# Patient Record
Sex: Female | Born: 1980 | Race: Black or African American | Hispanic: No | Marital: Single | Smoking: Never smoker
Health system: Southern US, Community
[De-identification: ages and names within clinical notes are randomized; demographics above are authoritative.]

## PROBLEM LIST (undated history)

## (undated) DIAGNOSIS — F32A Depression, unspecified: Secondary | ICD-10-CM

## (undated) DIAGNOSIS — E7800 Pure hypercholesterolemia, unspecified: Secondary | ICD-10-CM

## (undated) DIAGNOSIS — F329 Major depressive disorder, single episode, unspecified: Secondary | ICD-10-CM

## (undated) DIAGNOSIS — M069 Rheumatoid arthritis, unspecified: Secondary | ICD-10-CM

## (undated) HISTORY — DX: Rheumatoid arthritis, unspecified: M06.9

## (undated) HISTORY — DX: Pure hypercholesterolemia, unspecified: E78.00

## (undated) HISTORY — DX: Depression, unspecified: F32.A

## (undated) HISTORY — PX: TUBAL LIGATION: SHX77

## (undated) HISTORY — DX: Major depressive disorder, single episode, unspecified: F32.9

---

## 2018-03-11 ENCOUNTER — Encounter: Payer: Self-pay | Admitting: Medical

## 2018-03-11 ENCOUNTER — Other Ambulatory Visit: Payer: Self-pay

## 2018-03-11 ENCOUNTER — Ambulatory Visit: Payer: Medicaid Other | Admitting: Medical

## 2018-03-11 ENCOUNTER — Other Ambulatory Visit (HOSPITAL_COMMUNITY)
Admission: RE | Admit: 2018-03-11 | Discharge: 2018-03-11 | Disposition: A | Discharge status: Home / Self Care | Payer: Medicaid Other | Source: Ambulatory Visit | Attending: Medical | Admitting: Medical

## 2018-03-11 VITALS — BP 141/89 | HR 92 | Ht 62.0 in | Wt 189.0 lb

## 2018-03-11 DIAGNOSIS — Z01419 Encounter for gynecological examination (general) (routine) without abnormal findings: Secondary | ICD-10-CM

## 2018-03-11 DIAGNOSIS — N760 Acute vaginitis: Secondary | ICD-10-CM

## 2018-03-11 DIAGNOSIS — N765 Ulceration of vagina: Secondary | ICD-10-CM

## 2018-03-11 DIAGNOSIS — Z8742 Personal history of other diseases of the female genital tract: Secondary | ICD-10-CM

## 2018-03-11 DIAGNOSIS — Z0000 Encounter for general adult medical examination without abnormal findings: Secondary | ICD-10-CM

## 2018-03-11 DIAGNOSIS — M069 Rheumatoid arthritis, unspecified: Secondary | ICD-10-CM | POA: Insufficient documentation

## 2018-03-11 DIAGNOSIS — B9689 Other specified bacterial agents as the cause of diseases classified elsewhere: Secondary | ICD-10-CM

## 2018-03-11 NOTE — Patient Instructions (Addendum)
Pap Test Why am I having this test? A Pap test, also called a Pap smear, is a screening test to check for signs of:  Cancer of the vagina, cervix, and uterus. The cervix is the lower part of the uterus that opens into the vagina.  Infection.  Changes that may be a sign that cancer is developing (precancerous changes). Women need this test on a regular basis. In general, you should have a Pap test every 3 years until you reach menopause or age 37. Women aged 30-60 may choose to have their Pap test done at the same time as an HPV (human papillomavirus) test every 5 years (instead of every 3 years). Your health care provider may recommend having Pap tests more or less often depending on your medical conditions and past Pap test results. What kind of sample is taken?  Your health care provider will collect a sample of cells from the surface of your cervix. This will be done using a small cotton swab, plastic spatula, or brush. This sample is often collected during a pelvic exam, when you are lying on your back on an exam table with feet in footrests (stirrups). In some cases, fluids (secretions) from the cervix or vagina may also be collected. How do I prepare for this test?  Be aware of where you are in your menstrual cycle. If you are menstruating on the day of the test, you may be asked to reschedule.  You may need to reschedule if you have a known vaginal infection on the day of the test.  Follow instructions from your health care provider about: ? Changing or stopping your regular medicines. Some medicines can cause abnormal test results, such as digitalis and tetracycline. ? Avoiding douching or taking a bath the day before or the day of the test. Tell a health care provider about:  Any allergies you have.  All medicines you are taking, including vitamins, herbs, eye drops, creams, and over-the-counter medicines.  Any blood disorders you have.  Any surgeries you have had.  Any  medical conditions you have.  Whether you are pregnant or may be pregnant. How are the results reported? Your test results will be reported as either abnormal or normal. A false-positive result can occur. A false positive is incorrect because it means that a condition is present when it is not. A false-negative result can occur. A false negative is incorrect because it means that a condition is not present when it is. What do the results mean? A normal test result means that you do not have signs of cancer of the vagina, cervix, or uterus. An abnormal result may mean that you have:  Cancer. A Pap test by itself is not enough to diagnose cancer. You will have more tests done in this case.  Precancerous changes in your vagina, cervix, or uterus.  Inflammation of the cervix.  An STD (sexually transmitted disease).  A fungal infection.  A parasite infection. Talk with your health care provider about what your results mean. Questions to ask your health care provider Ask your health care provider, or the department that is doing the test:  When will my results be ready?  How will I get my results?  What are my treatment options?  What other tests do I need?  What are my next steps? Summary  In general, women should have a Pap test every 3 years until they reach menopause or age 37.  Your health care provider will collect a   sample of cells from the surface of your cervix. This will be done using a small cotton swab, plastic spatula, or brush.  In some cases, fluids (secretions) from the cervix or vagina may also be collected. This information is not intended to replace advice given to you by your health care provider. Make sure you discuss any questions you have with your health care provider. Document Released: 03/21/2002 Document Revised: 09/07/2016 Document Reviewed: 09/07/2016 Elsevier Interactive Patient Education  2019 [employer].  Contraception Choices Contraception,  also called birth control, means things to use or ways to try not to get pregnant. Hormonal birth control This kind of birth control uses hormones. Here are some types of hormonal birth control:  A tube that is put under skin of the arm (implant). The tube can stay in for as long as 3 years.  Shots to get every 3 months (injections).  Pills to take every day (birth control pills).  A patch to change 1 time each week for 3 weeks (birth control patch). After that, the patch is taken off for 1 week.  A ring to put in the vagina. The ring is left in for 3 weeks. Then it is taken out of the vagina for 1 week. Then a new ring is put in.  Pills to take after unprotected sex (emergency birth control pills). Barrier birth control Here are some types of barrier birth control:  A thin covering that is put on the penis before sex (female condom). The covering is thrown away after sex.  A soft, loose covering that is put in the vagina before sex (female condom). The covering is thrown away after sex.  A rubber bowl that sits over the cervix (diaphragm). The bowl must be made for you. The bowl is put into the vagina before sex. The bowl is left in for 6-8 hours after sex. It is taken out within 24 hours.  A small, soft cup that fits over the cervix (cervical cap). The cup must be made for you. The cup can be left in for 6-8 hours after sex. It is taken out within 48 hours.  A sponge that is put into the vagina before sex. It must be left in for at least 6 hours after sex. It must be taken out within 30 hours. Then it is thrown away.  A chemical that kills or stops sperm from getting into the uterus (spermicide). It may be a pill, cream, jelly, or foam to put in the vagina. The chemical should be used at least 10-15 minutes before sex. IUD (intrauterine) birth control An IUD is a small, T-shaped piece of plastic. It is put inside the uterus. There are two kinds:  Hormone IUD. This kind can stay in for  3-5 years.  Copper IUD. This kind can stay in for 10 years. Permanent birth control Here are some types of permanent birth control:  Surgery to block the fallopian tubes.  Having an insert put into each fallopian tube.  Surgery to tie off the tubes that carry sperm (vasectomy). Natural planning birth control Here are some types of natural planning birth control:  Not having sex on the days the woman could get pregnant.  Using a calendar: ? To keep track of the length of each period. ? To find out what days pregnancy can happen. ? To plan to not have sex on days when pregnancy can happen.  Watching for symptoms of ovulation and not having sex during ovulation. One way  the woman can check for ovulation is to check her temperature.  Waiting to have sex until after ovulation. Summary  Contraception, also called birth control, means things to use or ways to try not to get pregnant.  Hormonal methods of birth control include implants, injections, pills, patches, vaginal rings, and emergency birth control pills.  Barrier methods of birth control can include female condoms, female condoms, diaphragms, cervical caps, sponges, and spermicides.  There are two types of IUD (intrauterine device) birth control. An IUD can be put in a woman's uterus to prevent pregnancy for 3-5 years.  Permanent sterilization can be done through a procedure for males, females, or both.  Natural planning methods involve not having sex on the days when the woman could get pregnant. This information is not intended to replace advice given to you by your health care provider. Make sure you discuss any questions you have with your health care provider. Document Released: 10/26/2008 Document Revised: 08/05/2017 Document Reviewed: 01/09/2016 Elsevier Interactive Patient Education  2019 [employer].

## 2018-03-11 NOTE — Progress Notes (Signed)
Subjective:    Toni Vaughn is a 38 y.o. female who presents for an annual exam. The patient has complaint of vaginal odor x 3 weeks. She has a history of abnormal pap smear 10 and 16 years ago. The patient is sexually active. GYN screening history: last pap: approximate date 2018 and was normal. The patient participates in regular exercise: no. Has the patient ever been transfused or tattooed?: Yes - tattoo, No - transfusion. The patient reports that there is not domestic violence in her life.   Menstrual History: OB History    Gravida  5   Para  3   Term  3   Preterm  0   AB  2   Living  3     SAB  0   TAB  0   Ectopic  0   Multiple  0   Live Births  3           Menarche age: 38 years old  Patient's last menstrual period was 03/03/2018.    The following portions of the patient's history were reviewed and updated as appropriate: allergies, current medications, past family history, past medical history, past social history, past surgical history and problem list.  Review of Systems Review of Systems  Constitutional: Negative for fever.  Gastrointestinal: Negative for abdominal pain, constipation, diarrhea, nausea and vomiting.  Genitourinary: Negative for dysuria, frequency, hematuria and urgency.       + vaginal discharge Neg - vaginal bleeding     Objective:   Physical Exam  Nursing note and vitals reviewed. Constitutional: She is oriented to person, place, and time. She appears well-developed and well-nourished. No distress.  HENT:  Head: Normocephalic and atraumatic.  Eyes: EOM are normal.  Neck: Neck supple. No thyromegaly present.  Cardiovascular: Normal rate, regular rhythm and normal heart sounds.  Respiratory: Effort normal and breath sounds normal. No respiratory distress. She has no wheezes.  GI: Soft. Bowel sounds are normal. She exhibits no distension and no mass. There is no abdominal tenderness. There is no rebound and no guarding.    Genitourinary:    Uterus is not enlarged and not tender. Cervix exhibits no motion tenderness, no discharge and no friability. Right adnexum displays no mass and no tenderness. Left adnexum displays no mass and no tenderness.    Vaginal discharge (scant white, non-odorous) present.     No vaginal bleeding.  No bleeding in the vagina.  Neurological: She is alert and oriented to person, place, and time.  Skin: Skin is warm and dry. No erythema.  Psychiatric: She has a normal mood and affect.     Assessment:    Healthy female exam.   Birth Control Counseling   Plan:     Await pap smear results.   STD testing today  Patient will be contacted with any abnormal results Encouraged to sign up for MyChart Considering Depo Provera for birth control, does not want to start today Return to [facility] in 1 year or sooner PRN    Toni Vaughn 03/11/2018 11:37 AM

## 2018-03-12 LAB — HEPATITIS B SURFACE ANTIGEN: Hepatitis B Surface Ag: NEGATIVE

## 2018-03-12 LAB — HEPATITIS C ANTIBODY

## 2018-03-12 LAB — HIV ANTIBODY (ROUTINE TESTING W REFLEX): HIV Screen 4th Generation wRfx: NONREACTIVE

## 2018-03-12 LAB — RPR: RPR Ser Ql: NONREACTIVE

## 2018-03-14 LAB — HERPES SIMPLEX VIRUS CULTURE

## 2018-03-15 LAB — CERVICOVAGINAL ANCILLARY ONLY
Bacterial vaginitis: POSITIVE — AB
Candida vaginitis: NEGATIVE
Trichomonas: NEGATIVE

## 2018-03-15 LAB — CYTOLOGY - PAP
Chlamydia: NEGATIVE
Diagnosis: NEGATIVE
HPV: NOT DETECTED
Neisseria Gonorrhea: NEGATIVE

## 2018-03-16 MED ORDER — METRONIDAZOLE 500 MG PO TABS: 500.0000 mg | ORAL_TABLET | Freq: Two times a day (BID) | ORAL | 0 refills | Status: DC

## 2018-03-16 NOTE — Addendum Note (Signed)
Addended by: Marny Lowenstein on: 03/16/2018 01:18 PM   Modules accepted: Orders

## 2018-04-17 ENCOUNTER — Ambulatory Visit (HOSPITAL_COMMUNITY)
Admission: EM | Admit: 2018-04-17 | Discharge: 2018-04-17 | Disposition: A | Discharge status: Home / Self Care | Payer: Medicaid Other | Attending: Family Medicine | Admitting: Family Medicine

## 2018-04-17 ENCOUNTER — Other Ambulatory Visit: Payer: Self-pay

## 2018-04-17 ENCOUNTER — Encounter (HOSPITAL_COMMUNITY): Payer: Self-pay | Admitting: Emergency Medicine

## 2018-04-17 DIAGNOSIS — N3001 Acute cystitis with hematuria: Secondary | ICD-10-CM | POA: Diagnosis not present

## 2018-04-17 DIAGNOSIS — Z3202 Encounter for pregnancy test, result negative: Secondary | ICD-10-CM

## 2018-04-17 LAB — POCT PREGNANCY, URINE: Preg Test, Ur: NEGATIVE

## 2018-04-17 LAB — POCT URINALYSIS DIP (DEVICE)
Bilirubin Urine: NEGATIVE
Glucose, UA: NEGATIVE mg/dL
Ketones, ur: NEGATIVE mg/dL
Nitrite: NEGATIVE
Protein, ur: 100 mg/dL — AB
Specific Gravity, Urine: 1.025 (ref 1.005–1.030)
Urobilinogen, UA: 1 mg/dL (ref 0.0–1.0)
pH: 7.5 (ref 5.0–8.0)

## 2018-04-17 MED ORDER — CEPHALEXIN 500 MG PO CAPS: 500.0000 mg | ORAL_CAPSULE | Freq: Three times a day (TID) | ORAL | 0 refills | Status: DC

## 2018-04-17 NOTE — ED Provider Notes (Signed)
[facility]    CSN: [id number] Arrival date & time: 04/17/18  1140     History   Chief Complaint Chief Complaint  Patient presents with  . Urinary Urgency    HPI Toni Vaughn is a 38 y.o. female.   The patient presented to the [facility] with a complaint of urinary frequency/urgency and some mild lower abdominal pain x 3 days. The patient also reported some mild hematuria that started today.  No recent urinary tract infections.  Patient works as a Chief Strategy Officer.     Past Medical History:  Diagnosis Date  . Depression   . High cholesterol   . Rheumatoid arthritis [facility])     Patient Active Problem List   Diagnosis Date Noted  . Rheumatoid arthritis (HCC) 03/11/2018  . History of abnormal cervical Pap smear 03/11/2018    Past Surgical History:  Procedure Laterality Date  . TUBAL LIGATION      OB History    Gravida  5   Para  3   Term  3   Preterm  0   AB  2   Living  3     SAB  0   TAB  0   Ectopic  0   Multiple  0   Live Births  3            Home Medications    Prior to Admission medications   Medication Sig Start Date End Date Taking? Authorizing Provider  atorvastatin (LIPITOR) 40 MG tablet Take 40 mg by mouth daily.    [provider]  cephALEXin (KEFLEX) 500 MG capsule Take 1 capsule (500 mg total) by mouth 3 (three) times daily. 04/17/18   Elvina Sidle, MD  diphenhydrAMINE HCl (ALLERGY MED PO) Take by mouth as needed.    [provider]  escitalopram (LEXAPRO) 10 MG tablet Take 10 mg by mouth daily.    [provider]  folic acid (FOLVITE) 1 MG tablet Take 1 mg by mouth daily.    [provider]  meloxicam (MOBIC) 15 MG tablet Take 15 mg by mouth daily.    [provider]    Family History Family History  Problem Relation Age of Onset  . Hypertension Mother   . COPD Mother   . Emphysema Mother   . Cervical cancer Mother   . Mental illness Father     Social History  Social History   Tobacco Use  . Smoking status: Never Smoker  . Smokeless tobacco: Never Used  Substance Use Topics  . Alcohol use: Yes    Alcohol/week: 1.0 standard drinks    Types: 1 Standard drinks or equivalent per week  . Drug use: Never     Allergies   Patient has no known allergies.   Review of Systems Review of Systems  Gastrointestinal: Positive for abdominal pain.  Genitourinary: Positive for dysuria, hematuria and urgency.  All other systems reviewed and are negative.    Physical Exam Triage Vital Signs ED Triage Vitals [04/17/18 1233]  Enc Vitals Group     BP (!) 149/99     Pulse Rate 67     Resp 18     Temp 98 F (36.7 C)     Temp Source Oral     SpO2 100 %     Weight      Height      Head Circumference      Peak Flow      Pain Score 4  Pain Loc      Pain Edu?      Excl. in GC?    No data found.  Updated Vital Signs BP (!) 149/99 (BP Location: Right Arm)   Pulse 67   Temp 98 F (36.7 C) (Oral)   Resp 18   LMP 03/29/2018   SpO2 100%    Physical Exam Vitals signs and nursing note reviewed.  Constitutional:      Appearance: Normal appearance.  Neck:     Musculoskeletal: Normal range of motion.  Pulmonary:     Effort: Pulmonary effort is normal.  Musculoskeletal: Normal range of motion.  Skin:    General: Skin is warm and dry.  Neurological:     General: No focal deficit present.     Mental Status: She is alert.  Psychiatric:        Mood and Affect: Mood normal.      UC Treatments / Results  Labs (all labs ordered are listed, but only abnormal results are displayed) Labs Reviewed  POCT URINALYSIS DIP (DEVICE) - Abnormal; Notable for the following components:      Result Value   Hgb urine dipstick LARGE (*)    Protein, ur 100 (*)    Leukocytes,Ua SMALL (*)    All other components within normal limits  URINE CULTURE    EKG None  Radiology No results found.  Procedures Procedures (including critical care time)   Medications Ordered in UC Medications - No data to display  Initial Impression / Assessment and Plan / UC Course  I have reviewed the triage vital signs and the nursing notes.  Pertinent labs & imaging results that were available during my care of the patient were reviewed by me and considered in my medical decision making (see chart for details).    Final Clinical Impressions(s) / UC Diagnoses   Final diagnoses:  Acute cystitis with hematuria   Discharge Instructions   None    ED Prescriptions    Medication Sig Dispense Auth. Provider   cephALEXin (KEFLEX) 500 MG capsule Take 1 capsule (500 mg total) by mouth 3 (three) times daily. 20 capsule Elvina Sidle, MD     Controlled Substance Prescriptions [place] Controlled Substance Registry consulted? Not Applicable   Elvina Sidle, MD 04/17/18 1244

## 2018-04-17 NOTE — ED Triage Notes (Signed)
The patient presented to the [facility] with a complaint of urinary frequency/urgency and some mild lower abdominal pain x 3 days. The patient also reported some mild hematuria that started today.

## 2018-04-18 LAB — URINE CULTURE: Culture: NO GROWTH

## 2018-05-16 ENCOUNTER — Other Ambulatory Visit: Payer: Self-pay | Admitting: *Deleted

## 2018-05-16 DIAGNOSIS — N760 Acute vaginitis: Principal | ICD-10-CM

## 2018-05-16 DIAGNOSIS — B9689 Other specified bacterial agents as the cause of diseases classified elsewhere: Secondary | ICD-10-CM

## 2018-05-16 MED ORDER — METRONIDAZOLE 500 MG PO TABS: 500.0000 mg | ORAL_TABLET | Freq: Two times a day (BID) | ORAL | 0 refills | Status: DC

## 2018-05-16 NOTE — Progress Notes (Signed)
Flagyl sent for BV tx per protocol.

## 2018-07-05 ENCOUNTER — Other Ambulatory Visit: Payer: Self-pay

## 2018-07-05 DIAGNOSIS — B9689 Other specified bacterial agents as the cause of diseases classified elsewhere: Secondary | ICD-10-CM

## 2018-07-05 MED ORDER — METRONIDAZOLE 500 MG PO TABS: 500.0000 mg | ORAL_TABLET | Freq: Two times a day (BID) | ORAL | 0 refills | Status: DC

## 2018-07-05 NOTE — Progress Notes (Signed)
Pt has vaginal discharge with foul odor.

## 2018-08-02 ENCOUNTER — Other Ambulatory Visit: Payer: Self-pay

## 2018-08-02 ENCOUNTER — Other Ambulatory Visit: Payer: Medicaid Other

## 2018-08-02 DIAGNOSIS — Z20822 Contact with and (suspected) exposure to covid-19: Secondary | ICD-10-CM

## 2018-08-04 LAB — NOVEL CORONAVIRUS, NAA: SARS-CoV-2, NAA: NOT DETECTED

## 2018-10-10 ENCOUNTER — Other Ambulatory Visit: Payer: Self-pay

## 2018-10-10 ENCOUNTER — Ambulatory Visit (INDEPENDENT_AMBULATORY_CARE_PROVIDER_SITE_OTHER): Payer: Medicaid Other

## 2018-10-10 ENCOUNTER — Other Ambulatory Visit (HOSPITAL_COMMUNITY)
Admission: RE | Admit: 2018-10-10 | Discharge: 2018-10-10 | Disposition: A | Discharge status: Home / Self Care | Payer: Medicaid Other | Source: Ambulatory Visit | Attending: Obstetrics | Admitting: Obstetrics

## 2018-10-10 VITALS — BP 131/85 | HR 71 | Ht 62.0 in | Wt 194.0 lb

## 2018-10-10 DIAGNOSIS — N898 Other specified noninflammatory disorders of vagina: Secondary | ICD-10-CM | POA: Insufficient documentation

## 2018-10-10 NOTE — Progress Notes (Signed)
SUBJECTIVE:  38 y.o. female complains of malodorous vaginal discharge for 3 week(s). Denies abnormal vaginal bleeding or significant pelvic pain or fever. No UTI symptoms. Denies history of known exposure to STD.  Patient's last menstrual period was 09/28/2018 (exact date).  OBJECTIVE:  She appears well, afebrile. Urine dipstick: not done.  ASSESSMENT:  Vaginal Discharge  Vaginal Odor   PLAN:  GC, chlamydia, trichomonas, BVAG, CVAG probe sent to lab. Treatment: To be determined once lab results are received ROV prn if symptoms persist or worsen.

## 2018-10-11 ENCOUNTER — Other Ambulatory Visit: Payer: Self-pay | Admitting: Obstetrics

## 2018-10-11 DIAGNOSIS — B9689 Other specified bacterial agents as the cause of diseases classified elsewhere: Secondary | ICD-10-CM

## 2018-10-11 LAB — CERVICOVAGINAL ANCILLARY ONLY
Bacterial Vaginitis (gardnerella): POSITIVE — AB
Candida Glabrata: NEGATIVE
Candida Vaginitis: NEGATIVE
Chlamydia: NEGATIVE
Molecular Disclaimer: NEGATIVE
Molecular Disclaimer: NEGATIVE
Molecular Disclaimer: NEGATIVE
Molecular Disclaimer: NEGATIVE
Molecular Disclaimer: NORMAL
Molecular Disclaimer: NORMAL
Neisseria Gonorrhea: NEGATIVE
Trichomonas: NEGATIVE

## 2018-10-11 MED ORDER — TINIDAZOLE 500 MG PO TABS: 1000.0000 mg | ORAL_TABLET | Freq: Every day | ORAL | 2 refills | Status: DC

## 2018-10-12 ENCOUNTER — Telehealth: Payer: Self-pay | Admitting: *Deleted

## 2018-10-12 NOTE — Telephone Encounter (Signed)
-----   Message from Shelly Bombard, MD sent at 10/11/2018  4:34 PM EDT ----- [facility] Rx for BV

## 2018-12-28 DIAGNOSIS — Z79899 Other long term (current) drug therapy: Secondary | ICD-10-CM | POA: Insufficient documentation

## 2019-01-30 ENCOUNTER — Ambulatory Visit (HOSPITAL_COMMUNITY)
Admission: EM | Admit: 2019-01-30 | Discharge: 2019-01-30 | Disposition: A | Discharge status: Home / Self Care | Payer: Medicaid Other | Attending: Family Medicine | Admitting: Family Medicine

## 2019-01-30 ENCOUNTER — Encounter (HOSPITAL_COMMUNITY): Payer: Self-pay

## 2019-01-30 ENCOUNTER — Other Ambulatory Visit: Payer: Self-pay

## 2019-01-30 DIAGNOSIS — M545 Low back pain, unspecified: Secondary | ICD-10-CM

## 2019-01-30 MED ORDER — IBUPROFEN 600 MG PO TABS: 600.0000 mg | ORAL_TABLET | Freq: Four times a day (QID) | ORAL | 0 refills | Status: DC | PRN

## 2019-01-30 MED ORDER — CYCLOBENZAPRINE HCL 5 MG PO TABS: 5.0000 mg | ORAL_TABLET | Freq: Two times a day (BID) | ORAL | 0 refills | Status: DC | PRN

## 2019-01-30 NOTE — ED Provider Notes (Signed)
[place]    CSN: [id number] Arrival date & time: 01/30/19  1227      History   Chief Complaint Chief Complaint  Patient presents with   Fall   Back Pain    HPI Toni Vaughn is a 39 y.o. female history of rheumatoid arthritis presenting today for evaluation of back pain.  Patient states that 2 days ago she fell down approximately 3-4 indoor stairs.  Landed on her lower back.  Denies any immediate pain, the following day she has felt some tightness in her right lower back which has worsened over the past 24 to 48 hours.  She has had pain with certain movements and difficulty getting comfortable.  Denies radiation into legs.  Denies numbness or tingling.  Denies loss of control of urination or bowel movements.  Denies saddle anesthesia.  Denies history of similar or prior back problems.  She took ibuprofen 800 last night.  Is currently on a tapering dose of prednisone for her rheumatoid.  HPI  Past Medical History:  Diagnosis Date   Depression    High cholesterol    Rheumatoid arthritis [facility])     Patient Active Problem List   Diagnosis Date Noted   Rheumatoid arthritis ([place]) 03/11/2018   History of abnormal cervical Pap smear 03/11/2018    Past Surgical History:  Procedure Laterality Date   TUBAL LIGATION      OB History    Gravida  5   Para  3   Term  3   Preterm  0   AB  2   Living  3     SAB  0   TAB  0   Ectopic  0   Multiple  0   Live Births  3            Home Medications    Prior to Admission medications   Medication Sig Start Date End Date Taking? Authorizing Provider  atorvastatin (LIPITOR) 40 MG tablet Take 40 mg by mouth daily.    [provider]  cephALEXin (KEFLEX) 500 MG capsule Take 1 capsule (500 mg total) by mouth 3 (three) times daily. 04/17/18   Robyn Haber, MD  cyclobenzaprine (FLEXERIL) 5 MG tablet Take 1-2 tablets (5-10 mg total) by mouth 2 (two) times daily as needed for muscle spasms.  01/30/19   Marylou Wages C, PA-C  diphenhydrAMINE HCl (ALLERGY MED PO) Take by mouth as needed.    [provider]  escitalopram (LEXAPRO) 10 MG tablet Take 10 mg by mouth daily.    [provider]  etanercept (ENBREL) 50 MG/ML injection INJECT 1 ML UNDER THE SKIN ONCE A WEEK 09/20/18   [provider]  folic acid (FOLVITE) 1 MG tablet Take 1 mg by mouth daily.    [provider]  ibuprofen (ADVIL) 600 MG tablet Take 1 tablet (600 mg total) by mouth every 6 (six) hours as needed. 01/30/19   Dois Juarbe C, PA-C  meloxicam (MOBIC) 15 MG tablet Take 15 mg by mouth daily.    [provider]  metroNIDAZOLE (FLAGYL) 500 MG tablet Take 1 tablet (500 mg total) by mouth 2 (two) times daily. Patient not taking: Reported on 10/10/2018 07/05/18   Shelly Bombard, MD  tinidazole [facility]) 500 MG tablet Take 2 tablets (1,000 mg total) by mouth daily with breakfast. 10/11/18   Shelly Bombard, MD    Family History Family History  Problem Relation Age of Onset   Hypertension Mother  COPD Mother    Emphysema Mother    Cervical cancer Mother    Mental illness Father     Social History Social History   Tobacco Use   Smoking status: Never Smoker   Smokeless tobacco: Never Used  Substance Use Topics   Alcohol use: Yes    Alcohol/week: 1.0 standard drinks    Types: 1 Standard drinks or equivalent per week   Drug use: Never     Allergies   Patient has no known allergies.   Review of Systems Review of Systems  Constitutional: Negative for fatigue and fever.  Eyes: Negative for visual disturbance.  Respiratory: Negative for shortness of breath.   Cardiovascular: Negative for chest pain.  Gastrointestinal: Negative for abdominal pain, nausea and vomiting.  Genitourinary: Negative for decreased urine volume and difficulty urinating.  Musculoskeletal: Positive for back pain and myalgias. Negative for arthralgias and joint swelling.    Skin: Negative for color change, rash and wound.  Neurological: Negative for dizziness, weakness, light-headedness and headaches.     Physical Exam Triage Vital Signs ED Triage Vitals [01/30/19 1332]  Enc Vitals Group     BP (!) 148/91     Pulse Rate 66     Resp 16     Temp 98.5 F (36.9 C)     Temp Source Oral     SpO2 100 %     Weight      Height      Head Circumference      Peak Flow      Pain Score 8     Pain Loc      Pain Edu?      Excl. in GC?    No data found.  Updated Vital Signs BP (!) 148/91 (BP Location: Right Arm)    Pulse 66    Temp 98.5 F (36.9 C) (Oral)    Resp 16    SpO2 100%   Visual Acuity Right Eye Distance:   Left Eye Distance:   Bilateral Distance:    Right Eye Near:   Left Eye Near:    Bilateral Near:     Physical Exam Vitals and nursing note reviewed.  Constitutional:      Appearance: She is well-developed.     Comments: No acute distress  HENT:     Head: Normocephalic and atraumatic.     Nose: Nose normal.  Eyes:     Conjunctiva/sclera: Conjunctivae normal.  Cardiovascular:     Rate and Rhythm: Normal rate.  Pulmonary:     Effort: Pulmonary effort is normal. No respiratory distress.  Abdominal:     General: There is no distension.  Musculoskeletal:        General: Normal range of motion.     Cervical back: Neck supple.     Comments: Mild tenderness to palpation of mid lumbar spine, increased tenderness throughout right lumbar musculature  Hip strength 5/5 and equal bilaterally, knee strength 5/5 and equal bilaterally, patellar reflex 2+ bilaterally  Able to ambulate from chair to exam table without assistance or abnormality  Skin:    General: Skin is warm and dry.  Neurological:     Mental Status: She is alert and oriented to person, place, and time.      UC Treatments / Results  Labs (all labs ordered are listed, but only abnormal results are displayed) Labs Reviewed - No data to display  EKG   Radiology No  results found.  Procedures Procedures (including critical care time)  Medications Ordered in UC Medications - No data to display  Initial Impression / Assessment and Plan / UC Course  I have reviewed the triage vital signs and the nursing notes.  Pertinent labs & imaging results that were available during my care of the patient were reviewed by me and considered in my medical decision making (see chart for details).    Tenderness more lateral, most likely muscular strain, no immediate pain.  Will treat with anti-inflammatories and muscle relaxers.  Deferring imaging.  Gentle stretching, ice and heat.  No red flags for cauda equina.Discussed strict return precautions. Patient verbalized understanding and is agreeable with plan.  Final Clinical Impressions(s) / UC Diagnoses   Final diagnoses:  Acute right-sided low back pain without sciatica     Discharge Instructions     Use anti-inflammatories for pain/swelling. You may take up to 600-800 mg Ibuprofen every 8 hours with food. You may supplement Ibuprofen with Tylenol [phone] mg every 8 hours.   You may use flexeril as needed to help with pain. This is a muscle relaxer and causes sedation- please use only at bedtime or when you will be home and not have to drive/work  Alternate ice and heat  Gentle stretching of low back  Follow up if pain not improving or worsening   ED Prescriptions    Medication Sig Dispense Auth. Provider   cyclobenzaprine (FLEXERIL) 5 MG tablet Take 1-2 tablets (5-10 mg total) by mouth 2 (two) times daily as needed for muscle spasms. 24 tablet Sharhonda Atwood C, PA-C   ibuprofen (ADVIL) 600 MG tablet Take 1 tablet (600 mg total) by mouth every 6 (six) hours as needed. 30 tablet Tionna Gigante, [place] C, PA-C     PDMP not reviewed this encounter.   Lew Dawes, [place] 01/30/19 1419

## 2019-01-30 NOTE — Discharge Instructions (Signed)
Use anti-inflammatories for pain/swelling. You may take up to 600-800 mg Ibuprofen every 8 hours with food. You may supplement Ibuprofen with Tylenol [phone] mg every 8 hours.   You may use flexeril as needed to help with pain. This is a muscle relaxer and causes sedation- please use only at bedtime or when you will be home and not have to drive/work  Alternate ice and heat  Gentle stretching of low back  Follow up if pain not improving or worsening

## 2019-01-30 NOTE — ED Triage Notes (Signed)
Pt present she fall on Saturday on her back/buttocks and now she is having lower back pain.

## 2019-02-10 ENCOUNTER — Telehealth: Payer: Self-pay

## 2019-02-10 ENCOUNTER — Other Ambulatory Visit: Payer: Self-pay

## 2019-02-10 DIAGNOSIS — B9689 Other specified bacterial agents as the cause of diseases classified elsewhere: Secondary | ICD-10-CM

## 2019-02-10 DIAGNOSIS — N898 Other specified noninflammatory disorders of vagina: Secondary | ICD-10-CM

## 2019-02-10 MED ORDER — METRONIDAZOLE 500 MG PO TABS: 500.0000 mg | ORAL_TABLET | Freq: Two times a day (BID) | ORAL | 0 refills | Status: DC

## 2019-02-10 NOTE — Telephone Encounter (Signed)
Rx sent per protocol for BV sx's Pt made aware in MyChart.

## 2019-02-10 NOTE — Progress Notes (Signed)
Rx sent per protocol for BV sx's.  

## 2019-04-03 ENCOUNTER — Telehealth: Payer: Self-pay | Admitting: *Deleted

## 2019-04-03 ENCOUNTER — Other Ambulatory Visit: Payer: Self-pay | Admitting: Obstetrics

## 2019-04-03 NOTE — Telephone Encounter (Signed)
Fax request for refill on pt Escitalopram 10mg  tablets.    Please send refill if approved.

## 2019-04-03 NOTE — Telephone Encounter (Signed)
I did not Rx this for her in the past.  I don,t know what she is taking it for, and I can't find it in the chart.

## 2019-04-06 ENCOUNTER — Ambulatory Visit: Payer: Medicaid Other | Admitting: Family Medicine

## 2019-06-09 ENCOUNTER — Ambulatory Visit: Payer: Medicaid Other | Admitting: Medical

## 2019-08-18 ENCOUNTER — Ambulatory Visit: Payer: Medicaid Other | Admitting: Obstetrics and Gynecology

## 2019-10-05 ENCOUNTER — Ambulatory Visit: Payer: Medicaid Other | Admitting: Podiatry

## 2019-10-05 ENCOUNTER — Encounter: Payer: Self-pay | Admitting: Podiatry

## 2019-10-05 ENCOUNTER — Other Ambulatory Visit: Payer: Self-pay

## 2019-10-05 ENCOUNTER — Ambulatory Visit (INDEPENDENT_AMBULATORY_CARE_PROVIDER_SITE_OTHER): Payer: Medicaid Other

## 2019-10-05 DIAGNOSIS — M67472 Ganglion, left ankle and foot: Secondary | ICD-10-CM

## 2019-10-05 DIAGNOSIS — M7752 Other enthesopathy of left foot: Secondary | ICD-10-CM

## 2019-10-05 DIAGNOSIS — F32A Depression, unspecified: Secondary | ICD-10-CM | POA: Insufficient documentation

## 2019-10-05 DIAGNOSIS — M778 Other enthesopathies, not elsewhere classified: Secondary | ICD-10-CM

## 2019-10-05 DIAGNOSIS — M069 Rheumatoid arthritis, unspecified: Secondary | ICD-10-CM

## 2019-10-05 DIAGNOSIS — E785 Hyperlipidemia, unspecified: Secondary | ICD-10-CM | POA: Insufficient documentation

## 2019-10-05 DIAGNOSIS — F419 Anxiety disorder, unspecified: Secondary | ICD-10-CM | POA: Insufficient documentation

## 2019-10-05 NOTE — Progress Notes (Signed)
°  Subjective:  Patient ID: Toni Vaughn, female    DOB: 07-28-1980,  MRN: [id number] HPI Chief Complaint  Patient presents with   Foot Pain    Dorsal midfoot left - aching, knot, swelling x 1 year, thought was RA flare, currently on prednisone 10mg  QOD for RA, better today from taking that   New Patient (Initial Visit)    39 y.o. female presents with the above complaint.   ROS: Denies fever chills nausea vomiting muscle aches pains calf pain back pain chest pain shortness of breath no joint pain other than her left knee today associated with her rheumatoid arthritis.  Past Medical History:  Diagnosis Date   Depression    High cholesterol    Rheumatoid arthritis (HCC)    Past Surgical History:  Procedure Laterality Date   TUBAL LIGATION      Current Outpatient Medications:    predniSONE (DELTASONE) 10 MG tablet, Take 10 mg by mouth every other day., Disp: , Rfl:    atorvastatin (LIPITOR) 40 MG tablet, Take 40 mg by mouth daily., Disp: , Rfl:    cetirizine (ZYRTEC) 10 MG tablet, Take 10 mg by mouth daily., Disp: , Rfl:    diphenhydrAMINE HCl (ALLERGY MED PO), Take by mouth as needed., Disp: , Rfl:    escitalopram (LEXAPRO) 10 MG tablet, Take 10 mg by mouth daily., Disp: , Rfl:    leflunomide (ARAVA) 10 MG tablet, Take 10 mg by mouth daily., Disp: , Rfl:   No Known Allergies Review of Systems Objective:  There were no vitals filed for this visit.  General: Well developed, nourished, in no acute distress, alert and oriented x3   Dermatological: Skin is warm, dry and supple bilateral. Nails x 10 are well maintained; remaining integument appears unremarkable at this time. There are no open sores, no preulcerative lesions, no rash or signs of infection present.  Vascular: Dorsalis Pedis artery and Posterior Tibial artery pedal pulses are 2/4 bilateral with immedate capillary fill time. Pedal hair growth present. No varicosities and no lower extremity edema present  bilateral.   Neruologic: Grossly intact via light touch bilateral. Vibratory intact via tuning fork bilateral. Protective threshold with Semmes Wienstein monofilament intact to all pedal sites bilateral. Patellar and Achilles deep tendon reflexes 2+ bilateral. No Babinski or clonus noted bilateral.   Musculoskeletal: No gross boney pedal deformities bilateral. No pain, crepitus, or limitation noted with foot and ankle range of motion bilateral. Muscular strength 5/5 in all groups tested bilateral.  Soft tissue mass dorsal aspect left foot associated with the extensor tendons possibly associated with RA.  She also has tenderness on eversion of the forefoot on frontal plane around the second TMT J.  Gait: Unassisted, Nonantalgic.    Radiographs:  Radiographs demonstrate osseously mature individual mild osteopenia noted joint space narrowing second TMT J no spurring.  Otherwise no significant findings.  Soft tissue swelling dorsally.  Assessment & Plan:   Assessment: Capsulitis possible osteoarthritis possible ganglion dorsal aspect left foot.  Plan: I injected 10 mg of Kenalog 5 mg Marcaine to the area today explaining to her that if this does not resolve the problem may need to consider an MRI and surgical excision.  Also explained to her that this injection may leave her light or some white streaking.  She understands this is amenable to it.     Warrick Llera T. [place], [place]

## 2019-11-16 ENCOUNTER — Other Ambulatory Visit: Payer: Self-pay

## 2019-11-16 ENCOUNTER — Ambulatory Visit (INDEPENDENT_AMBULATORY_CARE_PROVIDER_SITE_OTHER): Payer: Medicaid Other | Admitting: Podiatry

## 2019-11-16 ENCOUNTER — Encounter: Payer: Self-pay | Admitting: Podiatry

## 2019-11-16 DIAGNOSIS — M67472 Ganglion, left ankle and foot: Secondary | ICD-10-CM

## 2019-11-16 DIAGNOSIS — M19072 Primary osteoarthritis, left ankle and foot: Secondary | ICD-10-CM

## 2019-11-16 DIAGNOSIS — M778 Other enthesopathies, not elsewhere classified: Secondary | ICD-10-CM

## 2019-11-16 NOTE — Progress Notes (Signed)
She presents today for follow-up of her capsulitis and ganglion to the dorsal aspect of her left foot.  States that it got better for about a week and then all of a sudden come back she states it just hurts all the time and is miserable is about limiting my ability to form a daily activities.  Objective: Vital signs are stable is alert oriented x3 has severe pain on palpation of the left foot overlying the midtarsal joints.  There is also small nonpulsatile mass overlying the extensor digitorum longus at the level of the midtarsal joint.  Exquisitely tender on palpation in frontal plane range of motion.  Assessment: Cannot rule out osteoarthritis no soft tissue mass of unexplained origin dorsal aspect of the foot.  Capsulitis.  Plan: Discussed etiology pathology conservative surgical therapies this point we are going to obtain an MRI of the left ankle to evaluate for surgical considerations.  So also help with diagnostics particularly for the soft tissue mass.

## 2019-11-17 NOTE — Addendum Note (Signed)
Addended by: Lottie Rater E on: 11/17/2019 01:14 PM   Modules accepted: Orders

## 2019-12-10 ENCOUNTER — Other Ambulatory Visit: Payer: Medicaid Other

## 2019-12-20 ENCOUNTER — Telehealth: Payer: Self-pay | Admitting: Podiatry

## 2019-12-20 DIAGNOSIS — M67472 Ganglion, left ankle and foot: Secondary | ICD-10-CM

## 2019-12-20 NOTE — Telephone Encounter (Signed)
[facility] called in stating there needed to be an order created for patient to capture image of forefoot along with ankle order

## 2019-12-20 NOTE — Addendum Note (Signed)
Addended by: Kristian Covey on: 12/20/2019 04:59 PM   Modules accepted: Orders

## 2019-12-22 NOTE — Telephone Encounter (Signed)
Please have order for ankle authorized

## 2019-12-22 NOTE — Telephone Encounter (Signed)
MRI left ankle order placed on 12/8 as requested by [facility], MRI left foot order was placed on 11/4 when patient was in for last office visit. It appears it has already been authorized by [employer].

## 2019-12-22 NOTE — Telephone Encounter (Signed)
Patient is going to [facility] tomorrow, They still have not received the order for the left ankle with and without contrast. The only thing that they received was left foot without contrast on 12/08

## 2019-12-23 ENCOUNTER — Other Ambulatory Visit: Payer: Medicaid Other

## 2019-12-23 ENCOUNTER — Inpatient Hospital Stay: Admission: RE | Admit: 2019-12-23 | Payer: Medicaid Other | Source: Ambulatory Visit

## 2020-02-02 ENCOUNTER — Other Ambulatory Visit: Payer: Medicaid Other

## 2020-02-06 ENCOUNTER — Encounter: Payer: Self-pay | Admitting: Podiatry

## 2020-02-06 ENCOUNTER — Ambulatory Visit (INDEPENDENT_AMBULATORY_CARE_PROVIDER_SITE_OTHER): Payer: Medicaid Other | Admitting: Podiatry

## 2020-02-06 ENCOUNTER — Other Ambulatory Visit: Payer: Self-pay

## 2020-02-06 DIAGNOSIS — M7751 Other enthesopathy of right foot: Secondary | ICD-10-CM

## 2020-02-06 DIAGNOSIS — M7989 Other specified soft tissue disorders: Secondary | ICD-10-CM | POA: Diagnosis not present

## 2020-02-06 DIAGNOSIS — M778 Other enthesopathies, not elsewhere classified: Secondary | ICD-10-CM | POA: Diagnosis not present

## 2020-02-06 DIAGNOSIS — M7752 Other enthesopathy of left foot: Secondary | ICD-10-CM

## 2020-02-06 DIAGNOSIS — M069 Rheumatoid arthritis, unspecified: Secondary | ICD-10-CM

## 2020-02-06 DIAGNOSIS — M19072 Primary osteoarthritis, left ankle and foot: Secondary | ICD-10-CM | POA: Diagnosis not present

## 2020-02-06 NOTE — Progress Notes (Signed)
She presents today for follow-up of her painful foot left.  States that recently she developed Covid and now both feet started hurting.  She states that she has extreme back pain with Covid and then it went into her feet.  She states the left foot hurts her back and hardly stand on it.  She goes on to say that she is currently taking 30mg  prednisone daily because of her rheumatoid and the pain that she is still experiencing she still takes her infusion every 2 months and is on oral medication for that as well.  Still having severe pain across the dorsum of the foot and in the sinus tarsi area left greater than right.  Objective: Vital signs are stable she is alert oriented x3 severe pain on palpation of the left ankle anteriorly it is warm to the touch and there is soft tissue mass overlying the dorsal lateral aspect of the left foot and ankle.  It is firm nonpulsatile and this is in the area where the anterior lateral fat pad should be however I am if it appears to be modulated.  She is also having pain in the sinus tarsi area just plantar to that and on and range of motion of the subtalar joint.  The left foot is swollen in the EMs dorsal lateral area and is warm to the touch as opposed to the right.  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  Subtalar joint capsulitis as well as ankle joint capsulitis.  Requesting MRI  Plan requesting MRI will follow up with her once that comes back.

## 2020-02-16 ENCOUNTER — Telehealth: Payer: Self-pay | Admitting: *Deleted

## 2020-02-16 ENCOUNTER — Other Ambulatory Visit: Payer: Self-pay

## 2020-02-16 NOTE — Telephone Encounter (Signed)
-----   Message from Waynard Reeds, RN sent at 02/14/2020  4:36 PM EST -----  pt that is sched for MRI auth on file had exp and needs to be extended if can! called insurances and stated it needs to be done by a dr Rica Koyanagi to update auth.

## 2020-02-19 ENCOUNTER — Ambulatory Visit
Admission: RE | Admit: 2020-02-19 | Discharge: 2020-02-19 | Disposition: A | Discharge status: Home / Self Care | Payer: Medicaid Other | Source: Ambulatory Visit | Attending: Podiatry | Admitting: Podiatry

## 2020-02-19 ENCOUNTER — Other Ambulatory Visit: Payer: Self-pay

## 2020-02-19 DIAGNOSIS — M069 Rheumatoid arthritis, unspecified: Secondary | ICD-10-CM

## 2020-02-19 DIAGNOSIS — M778 Other enthesopathies, not elsewhere classified: Secondary | ICD-10-CM

## 2020-02-19 DIAGNOSIS — M19072 Primary osteoarthritis, left ankle and foot: Secondary | ICD-10-CM

## 2020-02-19 DIAGNOSIS — M7989 Other specified soft tissue disorders: Secondary | ICD-10-CM

## 2020-02-19 MED ORDER — GADOBENATE DIMEGLUMINE 529 MG/ML IV SOLN
19.0000 mL | Freq: Once | INTRAVENOUS | Status: AC | PRN
  Administered 2020-02-19: 19 mL via INTRAVENOUS

## 2020-02-29 ENCOUNTER — Encounter: Payer: Self-pay | Admitting: Podiatry

## 2020-02-29 ENCOUNTER — Ambulatory Visit (INDEPENDENT_AMBULATORY_CARE_PROVIDER_SITE_OTHER): Payer: Medicaid Other | Admitting: Podiatry

## 2020-02-29 ENCOUNTER — Other Ambulatory Visit: Payer: Self-pay

## 2020-02-29 DIAGNOSIS — M069 Rheumatoid arthritis, unspecified: Secondary | ICD-10-CM | POA: Diagnosis not present

## 2020-02-29 DIAGNOSIS — M0629 Rheumatoid bursitis, multiple sites: Secondary | ICD-10-CM | POA: Diagnosis not present

## 2020-03-03 NOTE — Progress Notes (Signed)
She presents today for follow-up of her MRI she states that really feels about the same as it did previously.  Objective: Signs are stable alert and oriented x3. Pulses are palpable. Toes moderate severe pain on palpation left foot. Majority of the pain is across the midfoot and into the sinus tarsi area.  Assessment osteoarthritis synovitis per MRI.  Plan: Follow-up with [place] on an as-needed basis.

## 2020-03-12 ENCOUNTER — Ambulatory Visit: Payer: Medicaid Other | Admitting: Podiatry

## 2020-03-13 ENCOUNTER — Ambulatory Visit: Payer: Medicaid Other | Admitting: Podiatry

## 2021-01-03 ENCOUNTER — Ambulatory Visit: Payer: Medicaid Other | Admitting: Obstetrics and Gynecology

## 2021-01-07 ENCOUNTER — Other Ambulatory Visit (HOSPITAL_COMMUNITY)
Admission: RE | Admit: 2021-01-07 | Discharge: 2021-01-07 | Disposition: A | Discharge status: Home / Self Care | Payer: Medicaid Other | Source: Ambulatory Visit | Attending: Obstetrics and Gynecology | Admitting: Obstetrics and Gynecology

## 2021-01-07 ENCOUNTER — Encounter: Payer: Self-pay | Admitting: Obstetrics and Gynecology

## 2021-01-07 ENCOUNTER — Other Ambulatory Visit: Payer: Self-pay

## 2021-01-07 ENCOUNTER — Ambulatory Visit (INDEPENDENT_AMBULATORY_CARE_PROVIDER_SITE_OTHER): Payer: Medicaid Other | Admitting: Obstetrics and Gynecology

## 2021-01-07 DIAGNOSIS — Z01419 Encounter for gynecological examination (general) (routine) without abnormal findings: Secondary | ICD-10-CM | POA: Diagnosis not present

## 2021-01-07 DIAGNOSIS — Z202 Contact with and (suspected) exposure to infections with a predominantly sexual mode of transmission: Secondary | ICD-10-CM | POA: Diagnosis not present

## 2021-01-07 NOTE — Patient Instructions (Signed)

## 2021-01-07 NOTE — Progress Notes (Signed)
Toni Vaughn is a 40 y.o. [phone] female here for a routine annual gynecologic exam.  Current complaints: none. Desires STD testing.   Denies abnormal vaginal bleeding, discharge, pelvic pain, problems with intercourse or other gynecologic concerns.    Gynecologic History Patient's last menstrual period was 12/17/2020 (exact date). Contraception: none Last Pap: 2/20. Results were: normal Last mammogram: NA.   Obstetric History OB History  Gravida Para Term Preterm AB Living  5 3 3  0 2 3  SAB IAB Ectopic Multiple Live Births  0 0 0 0 3    # Outcome Date GA Lbr Len/2nd Weight Sex Delivery Anes PTL Lv  5 Term 12/18/04 [redacted]w[redacted]d   F Vag-Spont   LIV  4 Term 03/01/02 [redacted]w[redacted]d   M Vag-Spont   LIV  3 Term 01/08/01 [redacted]w[redacted]d   F Vag-Spont   LIV  2 AB           1 AB             Past Medical History:  Diagnosis Date   Depression    High cholesterol    Rheumatoid arthritis (HCC)     Past Surgical History:  Procedure Laterality Date   TUBAL LIGATION      Current Outpatient Medications on File Prior to Visit  Medication Sig Dispense Refill   atorvastatin (LIPITOR) 40 MG tablet Take 40 mg by mouth daily.     cetirizine (ZYRTEC) 10 MG tablet Take 10 mg by mouth daily.     escitalopram (LEXAPRO) 10 MG tablet Take 10 mg by mouth daily.     folic acid (FOLVITE) 1 MG tablet Take 1 mg by mouth daily.     golimumab 2 mg/kg in sodium chloride 0.9 % Inject into the vein every 8 (eight) weeks.     leflunomide (ARAVA) 10 MG tablet Take 10 mg by mouth daily.     methotrexate 2.5 MG tablet Take 8 mg by mouth once a week.     predniSONE (DELTASONE) 10 MG tablet Take 10 mg by mouth every other day.     No current facility-administered medications on file prior to visit.    No Known Allergies  Social History   Socioeconomic History   Marital status: Single    Spouse name: Not on file   Number of children: Not on file   Years of education: Not on file   Highest education level: Not on file   Occupational History   Not on file  Tobacco Use   Smoking status: Never   Smokeless tobacco: Never  Vaping Use   Vaping Use: Never used  Substance and Sexual Activity   Alcohol use: Yes    Alcohol/week: 1.0 standard drink    Types: 1 Standard drinks or equivalent per week   Drug use: Never   Sexual activity: Yes    Partners: Male    Birth control/protection: Condom, Surgical  Other Topics Concern   Not on file  Social History Narrative   Not on file   Social Determinants of Health   Financial Resource Strain: Not on file  Food Insecurity: Not on file  Transportation Needs: Not on file  Physical Activity: Not on file  Stress: Not on file  Social Connections: Not on file  Intimate Partner Violence: Not on file    Family History  Problem Relation Age of Onset   Hypertension Mother    COPD Mother    Emphysema Mother    Cervical cancer Mother    Mental  illness Father     The following portions of the patient's history were reviewed and updated as appropriate: allergies, current medications, past family history, past medical history, past social history, past surgical history and problem list.  Review of Systems Pertinent items noted in HPI and remainder of comprehensive ROS otherwise negative.   Objective:  BP (!) 146/90    Pulse 76    Ht 5\' 2"  (1.575 m)    Wt 190 lb (86.2 kg)    LMP 12/17/2020 (Exact Date)    BMI 34.75 kg/m   Chaperone present for exam  CONSTITUTIONAL: Well-developed, well-nourished female in no acute distress.  HENT:  Normocephalic, atraumatic, External right and left ear normal. Oropharynx is clear and moist EYES: Conjunctivae and EOM are normal. Pupils are equal, round, and reactive to light. No scleral icterus.  NECK: Normal range of motion, supple, no masses.  Normal thyroid.  SKIN: Skin is warm and dry. No rash noted. Not diaphoretic. No erythema. No pallor. NEUROLGIC: Alert and oriented to person, place, and time. Normal reflexes, muscle  tone coordination. No cranial nerve deficit noted. PSYCHIATRIC: Normal mood and affect. Normal behavior. Normal judgment and thought content. CARDIOVASCULAR: Normal heart rate noted, regular rhythm RESPIRATORY: Clear to auscultation bilaterally. Effort and breath sounds normal, no problems with respiration noted. BREASTS: Symmetric in size. No masses, skin changes, nipple drainage, or lymphadenopathy. ABDOMEN: Soft, normal bowel sounds, no distention noted.  No tenderness, rebound or guarding.  PELVIC: Normal appearing external genitalia; normal appearing vaginal mucosa and cervix.  No abnormal discharge noted.  Pap smear obtained.  Normal uterine size, no other palpable masses, no uterine or adnexal tenderness. MUSCULOSKELETAL: Normal range of motion. No tenderness.  No cyanosis, clubbing, or edema.  2+ distal pulses.   Assessment:  Annual gynecologic examination with pap smear STD testing  Plan:  Will follow up results of pap smear and manage accordingly. STD testing as per pt request Routine preventative health maintenance measures emphasized. Please refer to After Visit Summary for other counseling recommendations.    14/06/2020, MD, FACOG Attending Obstetrician & Gynecologist Center for [facility], [facility]

## 2021-01-07 NOTE — Progress Notes (Signed)
Pt is here today for yearly exam. LMP: 12/17/20 BCM: condoms Last pap: 02/2018 WNL. Pt would like to have STD screening done. No other problems or complaints.

## 2021-01-08 LAB — CERVICOVAGINAL ANCILLARY ONLY
Chlamydia: NEGATIVE
Comment: NEGATIVE
Comment: NEGATIVE
Comment: NORMAL
Neisseria Gonorrhea: NEGATIVE
Trichomonas: NEGATIVE

## 2021-01-08 LAB — RPR+HBSAG+HCVAB+...
HIV Screen 4th Generation wRfx: NONREACTIVE
Hep C Virus Ab: <0.1 s/co ratio (ref 0.0–0.9)
Hepatitis B Surface Ag: NEGATIVE
RPR Ser Ql: NONREACTIVE

## 2021-01-15 LAB — CYTOLOGY - PAP
Comment: NEGATIVE
Diagnosis: UNDETERMINED — AB
High risk HPV: NEGATIVE

## 2021-03-12 DIAGNOSIS — I10 Essential (primary) hypertension: Secondary | ICD-10-CM

## 2021-03-12 HISTORY — DX: Essential (primary) hypertension: I10

## 2021-03-26 ENCOUNTER — Ambulatory Visit (INDEPENDENT_AMBULATORY_CARE_PROVIDER_SITE_OTHER): Payer: Medicaid Other | Admitting: Emergency Medicine

## 2021-03-26 ENCOUNTER — Other Ambulatory Visit: Payer: Self-pay

## 2021-03-26 ENCOUNTER — Other Ambulatory Visit (HOSPITAL_COMMUNITY)
Admission: RE | Admit: 2021-03-26 | Discharge: 2021-03-26 | Disposition: A | Discharge status: Home / Self Care | Payer: Medicaid Other | Source: Ambulatory Visit | Attending: Obstetrics and Gynecology | Admitting: Obstetrics and Gynecology

## 2021-03-26 VITALS — BP 156/115 | HR 66 | Wt 192.4 lb

## 2021-03-26 DIAGNOSIS — N898 Other specified noninflammatory disorders of vagina: Secondary | ICD-10-CM | POA: Insufficient documentation

## 2021-03-26 NOTE — Progress Notes (Signed)
Patient was assessed and managed by nursing staff during this encounter. I have reviewed the chart and agree with the documentation and plan. I have also made any necessary editorial changes. ? ?Warden Fillers, MD ?03/26/2021 2:57 PM   ?

## 2021-03-26 NOTE — Progress Notes (Signed)
SUBJECTIVE:  ?41 y.o. female complains of clear vaginal itching  for 3-4 day(s). ? ?Denies abnormal vaginal bleeding or significant pelvic pain or ?fever. No UTI symptoms. Denies history of known exposure to STD. ? ?LMP 03/03/2021 ? ?OBJECTIVE:  ?She appears well, afebrile. ?Urine dipstick: not done. ? ?ASSESSMENT:  ?Vaginal Discharge - Denies  ?Vaginal Odor- Denies ? ? ?PLAN:  ?GC, chlamydia, trichomonas, BVAG, CVAG probe sent to lab. ?Treatment: To be determined once lab results are received ?ROV prn if symptoms persist or worsen.  ? ?Pt BPs elevated today- states that she is not being managed for hbp but that it always reads high during appointments. Denies headache, vision changes. Recommended pt to monitor BP and f/u with PCP. ?

## 2021-03-27 ENCOUNTER — Encounter: Payer: Self-pay | Admitting: Obstetrics and Gynecology

## 2021-03-27 LAB — CERVICOVAGINAL ANCILLARY ONLY
Bacterial Vaginitis (gardnerella): NEGATIVE
Candida Glabrata: NEGATIVE
Candida Vaginitis: POSITIVE — AB
Chlamydia: NEGATIVE
Comment: NEGATIVE
Comment: NEGATIVE
Comment: NEGATIVE
Comment: NEGATIVE
Comment: NEGATIVE
Comment: NORMAL
Neisseria Gonorrhea: NEGATIVE
Trichomonas: NEGATIVE

## 2021-03-28 ENCOUNTER — Other Ambulatory Visit: Payer: Self-pay | Admitting: *Deleted

## 2021-03-28 MED ORDER — FLUCONAZOLE 150 MG PO TABS: 150.0000 mg | ORAL_TABLET | Freq: Once | ORAL | 0 refills | Status: DC

## 2021-03-28 NOTE — Progress Notes (Signed)
Diflucan sent for +yeast See lab results 

## 2021-03-31 ENCOUNTER — Telehealth: Payer: Self-pay | Admitting: Emergency Medicine

## 2021-03-31 NOTE — Telephone Encounter (Signed)
T/C to patient regarding results- Patient states that she has already picked up medication and began taking them. Has no other questions or concerns.  ?

## 2021-04-02 ENCOUNTER — Other Ambulatory Visit: Payer: Self-pay

## 2021-04-02 ENCOUNTER — Ambulatory Visit
Admission: EM | Admit: 2021-04-02 | Discharge: 2021-04-02 | Disposition: A | Discharge status: Home / Self Care | Payer: Medicaid Other | Attending: Physician Assistant | Admitting: Physician Assistant

## 2021-04-02 DIAGNOSIS — J019 Acute sinusitis, unspecified: Secondary | ICD-10-CM

## 2021-04-02 MED ORDER — AMOXICILLIN-POT CLAVULANATE 875-125 MG PO TABS: 1.0000 | ORAL_TABLET | Freq: Two times a day (BID) | ORAL | 0 refills | Status: DC

## 2021-04-02 NOTE — ED Triage Notes (Signed)
Pt c/o nasal congestion, right ear ache, left headache, onset ~ Saturday.  ? ?Denies cough, sore throat, nausea, vomiting,  ?

## 2021-04-02 NOTE — ED Provider Notes (Signed)
?[facility] ? ? ? ?CSN: [id number] ?Arrival date & time: 04/02/21  1051 ? ? ?  ? ?History   ?Chief Complaint ?Chief Complaint  ?Patient presents with  ? Nasal Congestion  ? ? ?HPI ?Toni Vaughn is a 41 y.o. female.  ? ?Patient here today for nasal congestion, sinus pressure, ear pain, and headache that started about 5 days ago but seems to be worsening. She denies cough or sore throat. She has tried OTC meds without significant relief. She has not had any nausea, vomiting or diarrhea.  ? ?The history is provided by the patient.  ? ?Past Medical History:  ?Diagnosis Date  ? Depression   ? High cholesterol   ? Rheumatoid arthritis (HCC)   ? ? ?Patient Active Problem List  ? Diagnosis Date Noted  ? Visit for routine gyn exam 01/07/2021  ? Possible exposure to STD 01/07/2021  ? Anxiety 10/05/2019  ? Depression 10/05/2019  ? Hyperlipidemia 10/05/2019  ? Drug therapy 12/28/2018  ? Rheumatoid arthritis (HCC) 03/11/2018  ? History of abnormal cervical Pap smear 03/11/2018  ? ? ?Past Surgical History:  ?Procedure Laterality Date  ? TUBAL LIGATION    ? ? ?OB History   ? ? Gravida  ?5  ? Para  ?3  ? Term  ?3  ? Preterm  ?0  ? AB  ?2  ? Living  ?3  ?  ? ? SAB  ?0  ? IAB  ?0  ? Ectopic  ?0  ? Multiple  ?0  ? Live Births  ?3  ?   ?  ?  ? ? ? ?Home Medications   ? ?Prior to Admission medications   ?Medication Sig Start Date End Date Taking? Authorizing Provider  ?amoxicillin-clavulanate (AUGMENTIN) 875-125 MG tablet Take 1 tablet by mouth every 12 (twelve) hours. 04/02/21  Yes Tomi BambergerMyers, Mckynleigh Mussell F, PA-C  ?atorvastatin (LIPITOR) 40 MG tablet Take 40 mg by mouth daily.    [provider]  ?cetirizine (ZYRTEC) 10 MG tablet Take 10 mg by mouth daily. 04/27/19   [provider]  ?escitalopram (LEXAPRO) 10 MG tablet Take 10 mg by mouth daily.    [provider]  ?folic acid (FOLVITE) 1 MG tablet Take 1 mg by mouth daily. 12/28/20   [provider]  ?golimumab 2 mg/kg in sodium chloride 0.9 %  Inject into the vein every 8 (eight) weeks.    [provider]  ?leflunomide (ARAVA) 10 MG tablet Take 10 mg by mouth daily. 09/04/19   [provider]  ?methotrexate 2.5 MG tablet Take 8 mg by mouth once a week. 11/05/20   [provider]  ?predniSONE (DELTASONE) 10 MG tablet Take 10 mg by mouth every other day.    [provider]  ? ? ?Family History ?Family History  ?Problem Relation Age of Onset  ? Hypertension Mother   ? COPD Mother   ? Emphysema Mother   ? Cervical cancer Mother   ? Mental illness Father   ? ? ?Social History ?Social History  ? ?Tobacco Use  ? Smoking status: Never  ? Smokeless tobacco: Never  ?Vaping Use  ? Vaping Use: Never used  ?Substance Use Topics  ? Alcohol use: Yes  ?  Alcohol/week: 1.0 standard drink  ?  Types: 1 Standard drinks or equivalent per week  ? Drug use: Never  ? ? ? ?Allergies   ?Patient has no known allergies. ? ? ?Review of Systems ?Review of Systems  ?Constitutional:  Negative  for chills and fever.  ?HENT:  Positive for congestion, ear pain and sinus pressure. Negative for sore throat.   ?Eyes:  Negative for discharge and redness.  ?Respiratory:  Negative for cough, shortness of breath and wheezing.   ?Gastrointestinal:  Negative for abdominal pain, diarrhea, nausea and vomiting.  ? ? ?Physical Exam ?Triage Vital Signs ?ED Triage Vitals  ?Enc Vitals Group  ?   BP   ?   Pulse   ?   Resp   ?   Temp   ?   Temp src   ?   SpO2   ?   Weight   ?   Height   ?   Head Circumference   ?   Peak Flow   ?   Pain Score   ?   Pain Loc   ?   Pain Edu?   ?   Excl. in GC?   ? ?No data found. ? ?Updated Vital Signs ?BP (!) 160/117 (BP Location: Right Arm)   Pulse 71   Temp 98.2 ?F (36.8 ?C) (Oral)   Resp 18   SpO2 98%  ?   ? ?Physical Exam ?Vitals and nursing note reviewed.  ?Constitutional:   ?   General: She is not in acute distress. ?   Appearance: Normal appearance. She is not ill-appearing.  ?HENT:  ?   Head: Normocephalic and atraumatic.  ?    Nose: Congestion present.  ?   Mouth/Throat:  ?   Mouth: Mucous membranes are moist.  ?   Pharynx: No oropharyngeal exudate or posterior oropharyngeal erythema.  ?Eyes:  ?   Conjunctiva/sclera: Conjunctivae normal.  ?Cardiovascular:  ?   Rate and Rhythm: Normal rate.  ?Pulmonary:  ?   Effort: Pulmonary effort is normal. No respiratory distress.  ?Skin: ?   General: Skin is warm and dry.  ?Neurological:  ?   Mental Status: She is alert.  ?Psychiatric:     ?   Mood and Affect: Mood normal.     ?   Thought Content: Thought content normal.  ? ? ? ?UC Treatments / Results  ?Labs ?(all labs ordered are listed, but only abnormal results are displayed) ?Labs Reviewed - No data to display ? ?EKG ? ? ?Radiology ?No results found. ? ?Procedures ?Procedures (including critical care time) ? ?Medications Ordered in UC ?Medications - No data to display ? ?Initial Impression / Assessment and Plan / UC Course  ?I have reviewed the triage vital signs and the nursing notes. ? ?Pertinent labs & imaging results that were available during my care of the patient were reviewed by me and considered in my medical decision making (see chart for details). ? ?  ?Given duration of symptoms with worsening recommended we treat to cover sinusitis. Augmentin prescribed. Encouraged follow up with any further concerns.  ? ?Final Clinical Impressions(s) / UC Diagnoses  ? ?Final diagnoses:  ?Acute sinusitis, recurrence not specified, unspecified location  ? ?Discharge Instructions   ?None ?  ? ?ED Prescriptions   ? ? Medication Sig Dispense Auth. Provider  ? amoxicillin-clavulanate (AUGMENTIN) 875-125 MG tablet Take 1 tablet by mouth every 12 (twelve) hours. 14 tablet Tomi Bamberger, PA-C  ? ?  ? ?PDMP not reviewed this encounter. ?  ?Tomi Bamberger, PA-C ?04/02/21 1248 ? ?

## 2021-04-03 ENCOUNTER — Ambulatory Visit: Payer: Medicaid Other

## 2021-04-08 ENCOUNTER — Other Ambulatory Visit: Payer: Self-pay | Admitting: *Deleted

## 2021-04-08 ENCOUNTER — Encounter: Payer: Self-pay | Admitting: Obstetrics and Gynecology

## 2021-04-08 DIAGNOSIS — N898 Other specified noninflammatory disorders of vagina: Secondary | ICD-10-CM

## 2021-04-08 DIAGNOSIS — B3731 Acute candidiasis of vulva and vagina: Secondary | ICD-10-CM

## 2021-04-08 MED ORDER — FLUCONAZOLE 150 MG PO TABS: 150.0000 mg | ORAL_TABLET | Freq: Once | ORAL | 0 refills | Status: AC

## 2021-04-11 DIAGNOSIS — I10 Essential (primary) hypertension: Secondary | ICD-10-CM | POA: Insufficient documentation

## 2021-04-17 ENCOUNTER — Other Ambulatory Visit: Payer: Self-pay

## 2021-04-17 ENCOUNTER — Emergency Department (HOSPITAL_COMMUNITY): Payer: Medicaid Other

## 2021-04-17 ENCOUNTER — Ambulatory Visit
Admission: EM | Admit: 2021-04-17 | Discharge: 2021-04-17 | Disposition: A | Discharge status: Home / Self Care | Payer: Medicaid Other

## 2021-04-17 ENCOUNTER — Observation Stay (HOSPITAL_COMMUNITY)
Admission: EM | Admit: 2021-04-17 | Discharge: 2021-04-18 | Disposition: A | Discharge status: Home / Self Care | Payer: Medicaid Other | Attending: Internal Medicine | Admitting: Internal Medicine

## 2021-04-17 ENCOUNTER — Encounter (HOSPITAL_COMMUNITY): Payer: Self-pay

## 2021-04-17 DIAGNOSIS — I10 Essential (primary) hypertension: Secondary | ICD-10-CM | POA: Insufficient documentation

## 2021-04-17 DIAGNOSIS — R0789 Other chest pain: Secondary | ICD-10-CM | POA: Diagnosis not present

## 2021-04-17 DIAGNOSIS — Z79899 Other long term (current) drug therapy: Secondary | ICD-10-CM | POA: Diagnosis not present

## 2021-04-17 DIAGNOSIS — E876 Hypokalemia: Secondary | ICD-10-CM

## 2021-04-17 DIAGNOSIS — R079 Chest pain, unspecified: Secondary | ICD-10-CM | POA: Diagnosis present

## 2021-04-17 DIAGNOSIS — I2109 ST elevation (STEMI) myocardial infarction involving other coronary artery of anterior wall: Secondary | ICD-10-CM

## 2021-04-17 DIAGNOSIS — M069 Rheumatoid arthritis, unspecified: Secondary | ICD-10-CM | POA: Diagnosis present

## 2021-04-17 LAB — CREATININE, SERUM
Creatinine, Ser: 0.74 mg/dL (ref 0.44–1.00)
GFR, Estimated: >60 mL/min (ref >60)

## 2021-04-17 LAB — CBC
HCT: 37.6 % (ref 36.0–46.0)
Hemoglobin: 12.4 g/dL (ref 12.0–15.0)
MCH: 31 pg (ref 26.0–34.0)
MCHC: 33 g/dL (ref 30.0–36.0)
MCV: 94 fL (ref 80.0–100.0)
Platelets: 174 K/uL (ref 150–400)
RBC: 4 MIL/uL (ref 3.87–5.11)
RDW: 15 % (ref 11.5–15.5)
WBC: 6.1 K/uL (ref 4.0–10.5)
nRBC: 0 % (ref 0.0–0.2)

## 2021-04-17 LAB — BASIC METABOLIC PANEL
Anion gap: 9 (ref 5–15)
BUN: 6 mg/dL (ref 6–20)
CO2: 29 mmol/L (ref 22–32)
Calcium: 9.5 mg/dL (ref 8.9–10.3)
Chloride: 99 mmol/L (ref 98–111)
Creatinine, Ser: 0.71 mg/dL (ref 0.44–1.00)
GFR, Estimated: >60 mL/min (ref >60)
Glucose, Bld: 86 mg/dL (ref 70–99)
Potassium: 2.8 mmol/L — ABNORMAL LOW (ref 3.5–5.1)
Sodium: 137 mmol/L (ref 135–145)

## 2021-04-17 LAB — I-STAT BETA HCG BLOOD, ED (MC, WL, AP ONLY): I-stat hCG, quantitative: <5 m[IU]/mL

## 2021-04-17 LAB — HIV ANTIBODY (ROUTINE TESTING W REFLEX): HIV Screen 4th Generation wRfx: NONREACTIVE

## 2021-04-17 LAB — TROPONIN I (HIGH SENSITIVITY)
Troponin I (High Sensitivity): 3 ng/L
Troponin I (High Sensitivity): 4 ng/L

## 2021-04-17 MED ORDER — ISOSORBIDE MONONITRATE ER 30 MG PO TB24
30.0000 mg | ORAL_TABLET | Freq: Every day | ORAL | Status: DC
  Administered 2021-04-17: 30 mg via ORAL
  Filled 2021-04-17: qty 1

## 2021-04-17 MED ORDER — ASPIRIN EC 81 MG PO TBEC
81.0000 mg | DELAYED_RELEASE_TABLET | Freq: Every day | ORAL | Status: DC
  Administered 2021-04-17 – 2021-04-18 (×2): 81 mg via ORAL
  Filled 2021-04-17 (×2): qty 1

## 2021-04-17 MED ORDER — ACETAMINOPHEN 325 MG PO TABS
650.0000 mg | ORAL_TABLET | Freq: Four times a day (QID) | ORAL | Status: DC | PRN
  Administered 2021-04-17: 650 mg via ORAL
  Filled 2021-04-17 (×2): qty 2

## 2021-04-17 MED ORDER — NITROGLYCERIN 0.4 MG SL SUBL: 0.4000 mg | SUBLINGUAL_TABLET | SUBLINGUAL | Status: DC | PRN

## 2021-04-17 MED ORDER — ASPIRIN 81 MG PO CHEW
324.0000 mg | CHEWABLE_TABLET | Freq: Once | ORAL | Status: AC
  Administered 2021-04-17: 324 mg via ORAL

## 2021-04-17 MED ORDER — ACETAMINOPHEN 650 MG RE SUPP: 650.0000 mg | Freq: Four times a day (QID) | RECTAL | Status: DC | PRN

## 2021-04-17 MED ORDER — POTASSIUM CHLORIDE CRYS ER 20 MEQ PO TBCR
40.0000 meq | EXTENDED_RELEASE_TABLET | Freq: Two times a day (BID) | ORAL | Status: DC
  Filled 2021-04-17: qty 2

## 2021-04-17 MED ORDER — ATORVASTATIN CALCIUM 40 MG PO TABS
40.0000 mg | ORAL_TABLET | Freq: Every day | ORAL | Status: DC
  Administered 2021-04-18: 40 mg via ORAL
  Filled 2021-04-17: qty 1

## 2021-04-17 MED ORDER — FOLIC ACID 1 MG PO TABS: 1.0000 mg | ORAL_TABLET | Freq: Every day | ORAL | Status: DC

## 2021-04-17 MED ORDER — LEFLUNOMIDE 20 MG PO TABS
10.0000 mg | ORAL_TABLET | Freq: Every day | ORAL | Status: DC
  Administered 2021-04-18: 10 mg via ORAL
  Filled 2021-04-17: qty 0.5

## 2021-04-17 MED ORDER — POTASSIUM CHLORIDE CRYS ER 20 MEQ PO TBCR
40.0000 meq | EXTENDED_RELEASE_TABLET | ORAL | Status: AC
  Administered 2021-04-17 – 2021-04-18 (×2): 40 meq via ORAL
  Filled 2021-04-17 (×2): qty 2

## 2021-04-17 MED ORDER — ENOXAPARIN SODIUM 40 MG/0.4ML IJ SOSY
40.0000 mg | PREFILLED_SYRINGE | INTRAMUSCULAR | Status: DC
  Administered 2021-04-17: 40 mg via SUBCUTANEOUS
  Filled 2021-04-17: qty 0.4

## 2021-04-17 NOTE — ED Notes (Signed)
Patient is being discharged from the Urgent Care and sent to the Emergency Department via EMS. Per L. Blanchie Serve, patient is in need of higher level of care due to changes in EKG, chest pain . Patient is aware and verbalizes understanding of plan of care.  ?Vitals:  ? 04/17/21 1346  ?BP: (!) 131/91  ?Pulse: 91  ?Resp: 18  ?Temp: 98.2 ?F (36.8 ?C)  ?SpO2: 99%  ?  ?

## 2021-04-17 NOTE — ED Provider Notes (Signed)
?[facility] EMERGENCY DEPARTMENT ?Provider Note ? ? ?CSN: [id number] ?Arrival date & time: 04/17/21  1454 ? ?  ? ?History ? ?Chief Complaint  ?Patient presents with  ? Chest Pain  ? ? ?Carrell Palmatier is a 41 y.o. female presented emergency department with chest pressure ongoing for about a week, steadily worsened including at work today.  It was not particularly associated with exertion.  She did feel short of breath.  She still feels a pressure sitting on the left side of her chest and squeezing.  She went to urgent care where she was given aspirin, EMS was called and brought her to the ED, gave her 1 sublingual nitroglycerin which improved her pain from a 10 to a 7.  She reports has not had the symptoms before. ? ?She denies history of smoking, drug use, or significant family history of MI. ? ?She does report a recent diagnosis of hypertension at her PCPs and started on HCTZ about a week ago.  She also has a history of high cholesterol and rheumatoid arthritis.  She denies any known coronary history but is never seen a cardiologist. ? ?HPI ? ?  ? ?Home Medications ?Prior to Admission medications   ?Medication Sig Start Date End Date Taking? Authorizing Provider  ?atorvastatin (LIPITOR) 40 MG tablet Take 40 mg by mouth daily.   Yes [provider]  ?cetirizine (ZYRTEC) 10 MG tablet Take 10 mg by mouth daily. 04/27/19  Yes [provider]  ?folic acid (FOLVITE) 1 MG tablet Take 1 mg by mouth daily. 12/28/20  Yes [provider]  ?golimumab 2 mg/kg in sodium chloride 0.9 % Inject 2 mg/kg into the vein every 8 (eight) weeks.   Yes [provider]  ?hydrochlorothiazide (HYDRODIURIL) 25 MG tablet Take 25 mg by mouth daily. 04/09/21  Yes [provider]  ?leflunomide (ARAVA) 10 MG tablet Take 10 mg by mouth daily. 09/04/19  Yes [provider]  ?methotrexate 2.5 MG tablet Take 10 mg by mouth once a week. Sundays 11/05/20  Yes [provider]   ?amoxicillin-clavulanate (AUGMENTIN) 875-125 MG tablet Take 1 tablet by mouth every 12 (twelve) hours. ?Patient not taking: Reported on 04/17/2021 04/02/21   Tomi Bamberger, PA-C  ?predniSONE (DELTASONE) 10 MG tablet Take 10 mg by mouth every other day.    [provider]  ?   ? ?Allergies    ?Patient has no known allergies.   ? ?Review of Systems   ?Review of Systems ? ?Physical Exam ?Updated Vital Signs ?BP (!) 140/96   Pulse 96   Temp 98.9 ?F (37.2 ?C) (Oral)   Resp 18   Ht  (1.575 m)   Wt 86.2 kg   LMP 03/27/2021   SpO2 100%   BMI 34.75 kg/m?  ?Physical Exam ?Constitutional:   ?   General: She is not in acute distress. ?HENT:  ?   Head: Normocephalic and atraumatic.  ?Eyes:  ?   Conjunctiva/sclera: Conjunctivae normal.  ?   Pupils: Pupils are equal, round, and reactive to light.  ?Cardiovascular:  ?   Rate and Rhythm: Normal rate and regular rhythm.  ?Pulmonary:  ?   Effort: Pulmonary effort is normal. No respiratory distress.  ?Abdominal:  ?   General: There is no distension.  ?   Tenderness: There is no abdominal tenderness.  ?Skin: ?   General: Skin is warm and dry.  ?Neurological:  ?   General: No focal deficit present.  ?  Mental Status: She is alert. Mental status is at baseline.  ?Psychiatric:     ?   Mood and Affect: Mood normal.     ?   Behavior: Behavior normal.  ? ? ?ED Results / Procedures / Treatments   ?Labs ?(all labs ordered are listed, but only abnormal results are displayed) ?Labs Reviewed  ?BASIC METABOLIC PANEL - Abnormal; Notable for the following components:  ?    Result Value  ? Potassium 2.8 (*)   ? All other components within normal limits  ?CBC  ?HIV ANTIBODY (ROUTINE TESTING W REFLEX)  ?CBC  ?CREATININE, SERUM  ?MAGNESIUM  ?COMPREHENSIVE METABOLIC PANEL  ?CBC  ?I-STAT BETA HCG BLOOD, ED ([facility], [facility], [facility] ONLY)  ?TROPONIN I (HIGH SENSITIVITY)  ?TROPONIN I (HIGH SENSITIVITY)  ? ? ?EKG ?EKG Interpretation ? ?Date/Time:  Thursday April 17 2021 14:59:02 EDT ?Ventricular  Rate:  82 ?PR Interval:  138 ?QRS Duration: 72 ?QT Interval:  386 ?QTC Calculation: 450 ?R Axis:   38 ?Text Interpretation: Normal sinus rhythm Nonspecific T wave abnormality When compared with ECG of 17-Apr-2021 13:45, PREVIOUS ECG IS PRESENT Confirmed by Alvester Chourifan, Debby Clyne [phone]([id number]) on 04/17/2021 6:24:00 PM ? ?Radiology ?DG Chest 2 View ? ?Result Date: 04/17/2021 ?CLINICAL DATA:  Chest pain. EXAM: CHEST - 2 VIEW COMPARISON:  None. FINDINGS: The heart size and mediastinal contours are within normal limits. Both lungs are clear. The visualized skeletal structures are unremarkable. IMPRESSION: No active cardiopulmonary disease. Electronically Signed   By: Darliss CheneyAmy  Guttmann M.D.   On: 04/17/2021 16:09   ? ?Procedures ?Procedures  ? ? ?Medications Ordered in ED ?Medications  ?isosorbide mononitrate (IMDUR) 24 hr tablet 30 mg (30 mg Oral Given 04/17/21 1926)  ?potassium chloride SA (KLOR-CON M) CR tablet 40 mEq (has no administration in time range)  ?enoxaparin (LOVENOX) injection 40 mg (has no administration in time range)  ?acetaminophen (TYLENOL) tablet 650 mg (has no administration in time range)  ?  Or  ?acetaminophen (TYLENOL) suppository 650 mg (has no administration in time range)  ?aspirin EC tablet 81 mg (has no administration in time range)  ? ? ?ED Course/ Medical Decision Making/ A&P ?Clinical Course as of 04/17/21 2027  ?Thu Apr 17, 2021  ?1931 I discussed the case with Dr Rosemary HolmsPatwardhan from cardiology who felt that the likelihood of ACS was relatively low at this patient's age with negative troponins, but given the dynamic T wave changes, and a positive response to nitroglycerin, would be reasonable to keep her in observation overnight and obtain a stress test in the morning, if this is available.  He would be happy to consult tomorrow morning.  Patient will be admitted to the hospitalist on observation.  In discussion with cardiologist have started the patient on Imdur as a long-lasting nitroglycerin medication, 30 mg.   Patient was also told about the hypokalemia but had refused the potassium tablets at this time.  Hypokalemia may be chronic and dietary, but it may also be a response to the HCTZ medication. [MT]  ?2007 Admitted to hospitalist [MT]  ?  ?Clinical Course User Index ?[MT] Terald Sleeperrifan, Jessup Ogas J, MD  ? ?                        ?Medical Decision Making ?Amount and/or Complexity of Data Reviewed ?Labs: ordered. ?Radiology: ordered. ? ?Risk ?Prescription drug management. ?Decision regarding hospitalization. ? ? ?This patient presents to the Emergency Department with complaint of chest pain. This involves an extensive number  of treatment options, and is a complaint that carries with it a high risk of complications and morbidity, given the patient's comorbidity, including HTN, HLD, cardiac risk factors .The differential diagnosis includes ACS vs Pneumothorax vs Reflux/Gastritis vs MSK pain vs Pneumonia vs other. ? ?I felt PE was less likely given that she is no hypoxia, no tachycardia, no acute risk factors for PE. ? ?I ordered, reviewed, and interpreted labs.  Pertinent results include delta troponin is negative.  Mild hypokalemia with potassium of 2.8.  Labs otherwise unremarkable ?I ordered medication Imdur for chest pain ?I ordered imaging studies which included x-ray of the chest ?I independently visualized and interpreted imaging which showed no focal infiltrate and the monitor tracing which showed normal sinus. I agree with the radiologist interpretation ?Additional history was obtained from patient's partner at bedside ?External records obtained and reviewed showing urgent care note and EKG from today ?I personally reviewed the patients ECG which showed sinus rhythm with no acute ischemic findings.  However there were noted some dynamic T wave changes from her earlier EKG, which they were markedly inverted, and now appeared flattened or stabilized on the repeat EKG. ? ?HEART score of 4-5 (depending on unclear risk  factors) ? ?I spoke to cardiology consultant as noted in Ed course above. ? ?Patient admitted to the hospitalist service. ? ? ? ? ? ? ? ? ?Final Clinical Impression(s) / ED Diagnoses ?Final diagnoses:  ?Hypokalemia  ?Chest pre

## 2021-04-17 NOTE — Consult Note (Signed)
CARDIOLOGY CONSULT NOTE  ?Patient ID: ?Toni Vaughn ?MRN: [id number] ?DOB/AGE: Feb 23, 1980 41 y.o. ? ?Admit date: 04/17/2021 ?Referring Physician: [facility] ?Reason for Consultation:  Chest pain ? ?HPI:  ? ?41 y.o. African American female  with hypertension, hyperlipidemia, rheumatoid arthritis, with chest pain ? 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 For last couple days, she has experienced chest heaviness that initially lasted for couple min, but has been lasting all day for the last two days. There are not specific aggravating or relieving factors, pain is not necessarily worse with exertion. Pain did improve with SL NTG>  ? ?Personally reviewed all EKGs and telemetry,details below. Trop HS 3, 4.  ? ?Past Medical History:  ?Diagnosis Date  ? Depression   ? High cholesterol   ? Rheumatoid arthritis (HCC)   ?  ? ?Past Surgical History:  ?Procedure Laterality Date  ? TUBAL LIGATION    ?  ? ? ?Family History  ?Problem Relation Age of Onset  ? Hypertension Mother   ? COPD Mother   ? Emphysema Mother   ? Cervical cancer Mother   ? Mental illness Father   ?  ? ?Social History: ?Social History  ? ?Socioeconomic History  ? Marital status: Single  ?  Spouse name: Not on file  ? Number of children: Not on file  ? Years of education: Not on file  ? Highest education level: Not on file  ?Occupational History  ? Not on file  ?Tobacco Use  ? Smoking status: Never  ? Smokeless tobacco: Never  ?Vaping Use  ? Vaping Use: Never used  ?Substance and Sexual Activity  ? Alcohol use: Yes  ?  Alcohol/week: 1.0 standard drink  ?  Types: 1 Standard drinks or equivalent per week  ? Drug use: Never  ? Sexual activity: Yes  ?  Partners: Male  ?  Birth control/protection: Condom, Surgical  ?Other Topics Concern  ? Not on file  ?Social History Narrative  ? Not on file  ? ?Social Determinants of Health  ? ?Financial Resource Strain: Not on file  ?Food Insecurity: Not on file  ?Transportation Needs: Not  on file  ?Physical Activity: Not on file  ?Stress: Not on file  ?Social Connections: Not on file  ?Intimate Partner Violence: Not on file  ?  ? ?(Not in a hospital admission) ? ? ?Review of Systems  ?Cardiovascular:  Positive for chest pain. Negative for dyspnea on exertion, leg swelling, palpitations and syncope.  ?Respiratory:  Positive for shortness of breath (With chest pain).   ?  ? ?Physical Exam: ?Physical Exam ?Vitals and nursing note reviewed.  ?Constitutional:   ?   General: She is not in acute distress. ?Neck:  ?   Vascular: No JVD.  ?Cardiovascular:  ?   Rate and Rhythm: Normal rate and regular rhythm.  ?   Heart sounds: Normal heart sounds. No murmur heard. ?Pulmonary:  ?   Effort: Pulmonary effort is normal.  ?   Breath sounds: Normal breath sounds. No wheezing or rales.  ?Musculoskeletal:  ?   Right lower leg: No edema.  ?   Left lower leg: No edema.  ? ? ? ?  ?Imaging/tests reviewed and independently interpreted: ?Lab Results: ? Latest Reference Range & Units 04/17/21 15:07 04/17/21 17:17  ?Troponin I (High Sensitivity) <18 ng/L 3 4  ? ?K 2.8 ? ?Cardiac Studies: ? ?Telemetry 04/17/2021: ?Frequent ectopy, intermittent ventricular bigeminy ? ?EKG 04/17/2021: ?Sinus rhythm ?  Anterior T wave inversion, consider ischemia ?Improved on subsequent EKG ? ? ?Assessment & Recommendations: ? ? ?41 y.o. African American female  with hypertension, hyperlipidemia, rheumatoid arthritis, with chest pain ? ?Chest pain: ?Atypical. HS trop negative. ?EKG changes likely nonspecific. ?Low suspicion for ACS.  ?Patient with risk factors. Ischemic evaluation could be performed inpatient or outpatient. Given ventricular bigeminy in the setting of hypokalemia, recommend correction, followed by ischemic workup. Will perform nuclear stress test.  ? ?Discussed interpretation of tests and management recommendations with the primary team ? ?  ? ?Elder Negus, MD ?Pager: [phone] ?Office: [phone] ? ?

## 2021-04-17 NOTE — ED Triage Notes (Signed)
Pt states she has HTN, starting a new medication on 04/11/21. She states she has been having headaches, centralized chest pain (feels like pressure on her chest) since Sunday. She states the pain does not radiate. Patient denies numbness/ tingling and vision changes.  ?

## 2021-04-17 NOTE — ED Notes (Signed)
EMS activated.  

## 2021-04-17 NOTE — H&P (Signed)
?History and Physical  ? ? ?Harvest DarkJanel Vaughn ZOX:[id number] DOB: 10-Jan-1981 DOA: 04/17/2021 ? ?PCP: [facility]  ?Patient coming from: Home. ? ?Chief Complaint: Chest pain. ? ?HPI: Toni NewtonJanel Katrinka Vaughn is a 41 y.o. female with history of rheumatoid arthritis, hypertension, hyperlipidemia has been experiencing chest pain for the last 1 week.  Pain is mostly substernal.  Nonradiating not related to exertion.  Denies any associated shortness of breath productive cough fever or chills.  Due to the persistent pain patient presents to the ER. ? ?Patient states recently she was placed on hydrochlorothiazide by her primary care physician for treating hypertension and has started taking it over the last 2 weeks. ? ?ED Course: In the ER patient had EKG which showed nonspecific findings troponins were negative blood work showed hypokalemia.  On-call cardiologist Dr. Rosemary HolmsPatwardhan was consulted and patient admitted for further work-up for chest pain. ? ?Review of Systems: As per HPI, rest all negative. ? ? ?Past Medical History:  ?Diagnosis Date  ? Depression   ? High cholesterol   ? Rheumatoid arthritis (HCC)   ? ? ?Past Surgical History:  ?Procedure Laterality Date  ? TUBAL LIGATION    ? ? ? reports that she has never smoked. She has never used smokeless tobacco. She reports current alcohol use of about 1.0 standard drink per week. She reports that she does not use drugs. ? ?No Known Allergies ? ?Family History  ?Problem Relation Age of Onset  ? Hypertension Mother   ? COPD Mother   ? Emphysema Mother   ? Cervical cancer Mother   ? Mental illness Father   ? ? ?Prior to Admission medications   ?Medication Sig Start Date End Date Taking? Authorizing Provider  ?amoxicillin-clavulanate (AUGMENTIN) 875-125 MG tablet Take 1 tablet by mouth every 12 (twelve) hours. 04/02/21   Tomi BambergerMyers, Rebecca F, PA-C  ?atorvastatin (LIPITOR) 40 MG tablet Take 40 mg by mouth daily.    [provider]  ?cetirizine (ZYRTEC) 10 MG tablet  Take 10 mg by mouth daily. 04/27/19   [provider]  ?escitalopram (LEXAPRO) 10 MG tablet Take 10 mg by mouth daily.    [provider]  ?folic acid (FOLVITE) 1 MG tablet Take 1 mg by mouth daily. 12/28/20   [provider]  ?golimumab 2 mg/kg in sodium chloride 0.9 % Inject into the vein every 8 (eight) weeks.    [provider]  ?hydrochlorothiazide (HYDRODIURIL) 25 MG tablet  04/09/21   [provider]  ?leflunomide (ARAVA) 10 MG tablet Take 10 mg by mouth daily. 09/04/19   [provider]  ?methotrexate 2.5 MG tablet Take 8 mg by mouth once a week. 11/05/20   [provider]  ?predniSONE (DELTASONE) 10 MG tablet Take 10 mg by mouth every other day.    [provider]  ? ? ?Physical Exam: ?Constitutional: Moderately built and nourished. ?Vitals:  ? 04/17/21 1845 04/17/21 1915 04/17/21 1930 04/17/21 2000  ?BP: (!) 172/93 (!) 138/95 (!) 146/100 (!) 140/96  ?Pulse: 72 91 90 96  ?Resp: 10 15 14 18   ?Temp:      ?TempSrc:      ?SpO2: 100% 100% 100% 100%  ?Weight:      ?Height:      ? ?Eyes: Anicteric no pallor.   ?ENMT: No discharge from the ears eyes nose and mouth. ?Neck: No mass felt.  No neck rigidity. ?Respiratory: No rhonchi or crepitations. ?Cardiovascular: S1-S2 heard. ?Abdomen: Soft nontender bowel sound present. ?Musculoskeletal:  No edema. ?Skin: No rash. ?Neurologic: Alert awake oriented time place and person.  Moves all extremities. ?Psychiatric: Appears normal.  Normal affect. ? ? ?Labs on Admission: I have personally reviewed following labs and imaging studies ? ?CBC: ?Recent Labs  ?Lab 04/17/21 ?1507  ?WBC 6.1  ?HGB 12.4  ?HCT 37.6  ?MCV 94.0  ?PLT 174  ? ?Basic Metabolic Panel: ?Recent Labs  ?Lab 04/17/21 ?1507  ?NA 137  ?K 2.8*  ?CL 99  ?CO2 29  ?GLUCOSE 86  ?BUN 6  ?CREATININE 0.71  ?CALCIUM 9.5  ? ?GFR: ?Estimated Creatinine Clearance: 96.1 mL/min (by C-G formula based on SCr of 0.71 mg/dL). ?Liver Function Tests: ?No results for  input(s): AST, ALT, ALKPHOS, BILITOT, PROT, ALBUMIN in the last 168 hours. ?No results for input(s): LIPASE, AMYLASE in the last 168 hours. ?No results for input(s): AMMONIA in the last 168 hours. ?Coagulation Profile: ?No results for input(s): INR, PROTIME in the last 168 hours. ?Cardiac Enzymes: ?No results for input(s): CKTOTAL, CKMB, CKMBINDEX, TROPONINI in the last 168 hours. ?BNP (last 3 results) ?No results for input(s): PROBNP in the last 8760 hours. ?HbA1C: ?No results for input(s): HGBA1C in the last 72 hours. ?CBG: ?No results for input(s): GLUCAP in the last 168 hours. ?Lipid Profile: ?No results for input(s): CHOL, HDL, LDLCALC, TRIG, CHOLHDL, LDLDIRECT in the last 72 hours. ?Thyroid Function Tests: ?No results for input(s): TSH, T4TOTAL, FREET4, T3FREE, THYROIDAB in the last 72 hours. ?Anemia Panel: ?No results for input(s): VITAMINB12, FOLATE, FERRITIN, TIBC, IRON, RETICCTPCT in the last 72 hours. ?Urine analysis: ?   ?Component Value Date/Time  ? LABSPEC 1.025 04/17/2018 1235  ? PHURINE 7.5 04/17/2018 1235  ? GLUCOSEU NEGATIVE 04/17/2018 1235  ? HGBUR LARGE (A) 04/17/2018 1235  ? BILIRUBINUR NEGATIVE 04/17/2018 1235  ? KETONESUR NEGATIVE 04/17/2018 1235  ? PROTEINUR 100 (A) 04/17/2018 1235  ? UROBILINOGEN 1.0 04/17/2018 1235  ? NITRITE NEGATIVE 04/17/2018 1235  ? LEUKOCYTESUR SMALL (A) 04/17/2018 1235  ? ?Sepsis Labs: ? (procalcitonin:4,lacticidven:4) ?)No results found for this or any previous visit (from the past 240 hour(s)).  ? ?Radiological Exams on Admission: ?DG Chest 2 View ? ?Result Date: 04/17/2021 ?CLINICAL DATA:  Chest pain. EXAM: CHEST - 2 VIEW COMPARISON:  None. FINDINGS: The heart size and mediastinal contours are within normal limits. Both lungs are clear. The visualized skeletal structures are unremarkable. IMPRESSION: No active cardiopulmonary disease. Electronically Signed   By: Darliss Cheney M.D.   On: 04/17/2021 16:09   ? ?EKG: Independently reviewed.  Normal sinus rhythm  with nonspecific T wave changes. ? ?Assessment/Plan ?Principal Problem: ?  Chest pain ?Active Problems: ?  Rheumatoid arthritis (HCC) ?  ? ?Chest pain -cardiac markers are negative so far.  EKG shows nonspecific findings.  Cardiologist has been consulted plan is to do stress test we will keep patient n.p.o. past midnight.  Aspirin.  Was given 1 dose of Imdur. ?History of rheumatoid arthritis we will continue home dose of immunomodulators. ?Hypokalemia likely from recently being started on hydrochlorothiazide.  Replace and recheck.  Hold hydrochlorothiazide until potassium corrected.  Check magnesium levels. ?Hypertension holding hydrochlorothiazide due to hypokalemia.  We will keep patient.  On as needed IV labetalol.  Patient did receive 1 dose of Imdur.  ?Hyperlipidemia on statins. ? ? ?DVT prophylaxis: Lovenox. ?Code Status: Full code. ?Family Communication: Discussed with patient. ?Disposition Plan: Home. ?Consults called: Cardiology. ?Admission status: Observation. ? ? ?Eduard Clos MD ?[facility] ?Pager [phone]. ? ?If 7PM-7AM, please contact night-coverage ?  [email or web address] ?Password [email or web address] ? ?04/17/2021, 8:22 PM  ? ? ? ?

## 2021-04-17 NOTE — ED Provider Notes (Addendum)
?[facility] ? ? ? ?CSN: [id number] ?Arrival date & time: 04/17/21  1316 ?  ? ?HISTORY  ? ?Chief Complaint  ?Patient presents with  ? Chest Pain  ? ?HPI ?Toni Vaughn is a 41 y.o. female. Pt states she has HTN which has been consistently worse for the past 2 weeks.  Patient states blood pressure at home has been as high as 188/120.  Patient states she started taking hydrochlorothiazide last week.  Patient states she has continued to have headaches, centralized chest pain that feels like pressure as if someone is sitting on her chest.  Patient states that the chest pressure began 4 days ago.  Patient states she has been aggressively treating herself for asthma exacerbation given her history of asthma with albuterol treatments.  Patient states this has not relieved her chest pressure.  Patient states that the pain in her chest does not radiate to any other location.  Patient denies vision changes, numbness tingling in her extremities.  Patient states she is never had chest pain like this before. ? ?The history is provided by the patient.  ?Past Medical History:  ?Diagnosis Date  ? Depression   ? High cholesterol   ? Rheumatoid arthritis (HCC)   ? ?Patient Active Problem List  ? Diagnosis Date Noted  ? Visit for routine gyn exam 01/07/2021  ? Possible exposure to STD 01/07/2021  ? Anxiety 10/05/2019  ? Depression 10/05/2019  ? Hyperlipidemia 10/05/2019  ? Drug therapy 12/28/2018  ? Rheumatoid arthritis (HCC) 03/11/2018  ? History of abnormal cervical Pap smear 03/11/2018  ? ?Past Surgical History:  ?Procedure Laterality Date  ? TUBAL LIGATION    ? ?OB History   ? ? Gravida  ?5  ? Para  ?3  ? Term  ?3  ? Preterm  ?0  ? AB  ?2  ? Living  ?3  ?  ? ? SAB  ?0  ? IAB  ?0  ? Ectopic  ?0  ? Multiple  ?0  ? Live Births  ?3  ?   ?  ?  ? ?Home Medications   ? ?Prior to Admission medications   ?Medication Sig Start Date End Date Taking? Authorizing Provider  ?hydrochlorothiazide (HYDRODIURIL) 25 MG tablet  04/09/21  Yes  [provider]  ?amoxicillin-clavulanate (AUGMENTIN) 875-125 MG tablet Take 1 tablet by mouth every 12 (twelve) hours. 04/02/21   Tomi Bamberger, PA-C  ?atorvastatin (LIPITOR) 40 MG tablet Take 40 mg by mouth daily.    [provider]  ?cetirizine (ZYRTEC) 10 MG tablet Take 10 mg by mouth daily. 04/27/19   [provider]  ?escitalopram (LEXAPRO) 10 MG tablet Take 10 mg by mouth daily.    [provider]  ?folic acid (FOLVITE) 1 MG tablet Take 1 mg by mouth daily. 12/28/20   [provider]  ?golimumab 2 mg/kg in sodium chloride 0.9 % Inject into the vein every 8 (eight) weeks.    [provider]  ?leflunomide (ARAVA) 10 MG tablet Take 10 mg by mouth daily. 09/04/19   [provider]  ?methotrexate 2.5 MG tablet Take 8 mg by mouth once a week. 11/05/20   [provider]  ?predniSONE (DELTASONE) 10 MG tablet Take 10 mg by mouth every other day.    [provider]  ? ? ?Family History ?Family History  ?Problem Relation Age of Onset  ? Hypertension Mother   ? COPD Mother   ? Emphysema Mother   ? Cervical cancer Mother   ?  Mental illness Father   ? ?Social History ?Social History  ? ?Tobacco Use  ? Smoking status: Never  ? Smokeless tobacco: Never  ?Vaping Use  ? Vaping Use: Never used  ?Substance Use Topics  ? Alcohol use: Yes  ?  Alcohol/week: 1.0 standard drink  ?  Types: 1 Standard drinks or equivalent per week  ? Drug use: Never  ? ?Allergies   ?Patient has no known allergies. ? ?Review of Systems ?Review of Systems ?Pertinent findings noted in history of present illness.  ? ?Physical Exam ?Triage Vital Signs ?ED Triage Vitals  ?Enc Vitals Group  ?   BP 11/08/20 0827 (!) 147/82  ?   Pulse Rate 11/08/20 0827 72  ?   Resp 11/08/20 0827 18  ?   Temp 11/08/20 0827 98.3 ?F (36.8 ?C)  ?   Temp Source 11/08/20 0827 Oral  ?   SpO2 11/08/20 0827 98 %  ?   Weight --   ?   Height --   ?   Head Circumference --   ?   Peak Flow --   ?   Pain Score  11/08/20 0826 5  ?   Pain Loc --   ?   Pain Edu? --   ?   Excl. in GC? --   ?No data found. ? ?Updated Vital Signs ?BP (!) 131/91 (BP Location: Left Arm)   Pulse 91   Temp 98.2 ?F (36.8 ?C) (Oral)   Resp 18   LMP 03/27/2021   SpO2 99%  ? ?Physical Exam ?Vitals and nursing note reviewed.  ?Constitutional:   ?   General: She is awake. She is not in acute distress. ?   Appearance: Normal appearance. She is well-developed and well-groomed. She is obese. She is not ill-appearing, toxic-appearing or diaphoretic.  ?HENT:  ?   Head: Normocephalic and atraumatic.  ?Eyes:  ?   General: Lids are normal.     ?   Right eye: No discharge.     ?   Left eye: No discharge.  ?   Extraocular Movements: Extraocular movements intact.  ?   Conjunctiva/sclera: Conjunctivae normal.  ?   Right eye: Right conjunctiva is not injected.  ?   Left eye: Left conjunctiva is not injected.  ?Neck:  ?   Trachea: Trachea and phonation normal.  ?Cardiovascular:  ?   Rate and Rhythm: Normal rate and regular rhythm.  ?   Pulses: Normal pulses.  ?   Heart sounds: Normal heart sounds, S1 normal and S2 normal. No murmur heard. ?  No friction rub. No gallop.  ?Pulmonary:  ?   Effort: Pulmonary effort is normal. No tachypnea, bradypnea, accessory muscle usage, prolonged expiration, respiratory distress or retractions.  ?   Breath sounds: Normal breath sounds. No stridor, decreased air movement or transmitted upper airway sounds. No decreased breath sounds, wheezing, rhonchi or rales.  ?Chest:  ?   Chest wall: No mass, lacerations, deformity, swelling, tenderness, crepitus or edema. There is no dullness to percussion.  ?Breasts: ?   Breasts are symmetrical.  ?Musculoskeletal:     ?   General: Normal range of motion.  ?   Cervical back: Normal range of motion and neck supple. Normal range of motion.  ?Lymphadenopathy:  ?   Cervical: No cervical adenopathy.  ?Skin: ?   General: Skin is warm and dry.  ?   Findings: No erythema or rash.  ?Neurological:  ?    General: No focal deficit present.  ?  Mental Status: She is alert and oriented to person, place, and time.  ?Psychiatric:     ?   Mood and Affect: Mood normal.     ?   Behavior: Behavior normal. Behavior is cooperative.  ? ? ?Visual Acuity ?Right Eye Distance:   ?Left Eye Distance:   ?Bilateral Distance:   ? ?Right Eye Near:   ?Left Eye Near:    ?Bilateral Near:    ? ?UC Couse / Diagnostics / Procedures:  ?  ?EKG ? ?Radiology ?No results found. ? ?Procedures ?Procedures (including critical care time) ? ?UC Diagnoses / Final Clinical Impressions(s)   ?I have reviewed the triage vital signs and the nursing notes. ? ?Pertinent labs & imaging results that were available during my care of the patient were reviewed by me and considered in my medical decision making (see chart for details).   ? ?Final diagnoses:  ?Acute MI anterior wall first episode care [facility](HCC)  ?Chest pressure  ?Essential hypertension  ? ?EKG concerning for anterior MI, patient was provided with 324 mg of aspirin, IV was started in left arm and patient transported to the emergency room via EMS for further evaluation for acute MI. ? ?ED Prescriptions   ?None ?  ? ?PDMP not reviewed this encounter. ? ?Pending results:  ?Labs Reviewed - No data to display ? ?Medications Ordered in UC: ?Medications  ?aspirin chewable tablet 324 mg (324 mg Oral Given 04/17/21 1412)  ? ? ?Disposition Upon Discharge:  ?Condition: stable for discharge to ED via EMS ? ?Patient presented with an acute illness with associated systemic symptoms and significant discomfort requiring urgent management. In my opinion, this is a condition that a prudent lay person (someone who possesses an average knowledge of health and medicine) may potentially expect to result in complications if not addressed urgently such as respiratory distress, impairment of bodily function or dysfunction of bodily organs.  ? ?Routine symptom specific, illness specific and/or disease specific instructions were  discussed with the patient and/or caregiver at length.  ? ?As such, the patient has been evaluated and assessed, work-up was performed and treatment was provided in alignment with urgent care protocols and eviden

## 2021-04-17 NOTE — ED Triage Notes (Signed)
PER EMS: pt is from Urgent Care with c/o constant but worsening non-radiating generalized chest pressure x 1 week associated with exertional SOB and fatigue. She reports UC told her she was having symptoms of a heart attack. ?UC gave her 324 aspirin. EMS gave her 1 nitro SL. Pain went to a 4 from a 7. ? ?BP-130/80, HR-108, RR-18, 98% ?20G LFA ?

## 2021-04-18 ENCOUNTER — Observation Stay (HOSPITAL_COMMUNITY): Payer: Medicaid Other

## 2021-04-18 LAB — COMPREHENSIVE METABOLIC PANEL
ALT: 24 U/L (ref 0–44)
AST: 28 U/L (ref 15–41)
Albumin: 3.1 g/dL — ABNORMAL LOW (ref 3.5–5.0)
Alkaline Phosphatase: 34 U/L — ABNORMAL LOW (ref 38–126)
Anion gap: 9 (ref 5–15)
BUN: 9 mg/dL (ref 6–20)
CO2: 22 mmol/L (ref 22–32)
Calcium: 8.5 mg/dL — ABNORMAL LOW (ref 8.9–10.3)
Chloride: 104 mmol/L (ref 98–111)
Creatinine, Ser: 0.68 mg/dL (ref 0.44–1.00)
GFR, Estimated: >60 mL/min (ref >60)
Glucose, Bld: 90 mg/dL (ref 70–99)
Potassium: 4.2 mmol/L (ref 3.5–5.1)
Sodium: 135 mmol/L (ref 135–145)
Total Bilirubin: 0.6 mg/dL (ref 0.3–1.2)
Total Protein: 5.9 g/dL — ABNORMAL LOW (ref 6.5–8.1)

## 2021-04-18 LAB — CBC
HCT: 33.8 % — ABNORMAL LOW (ref 36.0–46.0)
Hemoglobin: 11.1 g/dL — ABNORMAL LOW (ref 12.0–15.0)
MCH: 30.9 pg (ref 26.0–34.0)
MCHC: 32.8 g/dL (ref 30.0–36.0)
MCV: 94.2 fL (ref 80.0–100.0)
Platelets: 162 10*3/uL (ref 150–400)
RBC: 3.59 MIL/uL — ABNORMAL LOW (ref 3.87–5.11)
RDW: 15.2 % (ref 11.5–15.5)
WBC: 6.6 10*3/uL (ref 4.0–10.5)
nRBC: 0 % (ref 0.0–0.2)

## 2021-04-18 LAB — MAGNESIUM: Magnesium: 1.6 mg/dL — ABNORMAL LOW (ref 1.7–2.4)

## 2021-04-18 MED ORDER — REGADENOSON 0.4 MG/5ML IV SOLN
INTRAVENOUS | Status: AC
Start: 2021-04-18 — End: 2021-04-18
  Administered 2021-04-18: 0.4 mg via INTRAVENOUS
  Filled 2021-04-18: qty 5

## 2021-04-18 MED ORDER — ASPIRIN 81 MG PO TBEC: 81.0000 mg | DELAYED_RELEASE_TABLET | Freq: Every day | ORAL | 11 refills | Status: DC

## 2021-04-18 MED ORDER — REGADENOSON 0.4 MG/5ML IV SOLN: 0.4000 mg | Freq: Once | INTRAVENOUS | Status: AC

## 2021-04-18 MED ORDER — MAGNESIUM SULFATE 2 GM/50ML IV SOLN
2.0000 g | Freq: Once | INTRAVENOUS | Status: AC
  Administered 2021-04-18: 2 g via INTRAVENOUS
  Filled 2021-04-18: qty 50

## 2021-04-18 MED ORDER — LABETALOL HCL 5 MG/ML IV SOLN: 5.0000 mg | INTRAVENOUS | Status: DC | PRN

## 2021-04-18 MED ORDER — MAGNESIUM SULFATE 2 GM/50ML IV SOLN
2.0000 g | Freq: Once | INTRAVENOUS | Status: DC
Start: 2021-04-18 — End: 2021-04-18

## 2021-04-18 MED ORDER — PROCHLORPERAZINE EDISYLATE 10 MG/2ML IJ SOLN
5.0000 mg | Freq: Once | INTRAMUSCULAR | Status: AC
  Administered 2021-04-18: 5 mg via INTRAVENOUS
  Filled 2021-04-18: qty 2

## 2021-04-18 MED ORDER — TRAMADOL HCL 50 MG PO TABS
25.0000 mg | ORAL_TABLET | Freq: Once | ORAL | Status: AC
  Administered 2021-04-18: 25 mg via ORAL
  Filled 2021-04-18: qty 1

## 2021-04-18 MED ORDER — TECHNETIUM TC 99M TETROFOSMIN IV KIT
32.0000 | PACK | Freq: Once | INTRAVENOUS | Status: AC | PRN
  Administered 2021-04-18: 32 via INTRAVENOUS

## 2021-04-18 MED ORDER — BUTALBITAL-APAP-CAFFEINE 50-325-40 MG PO TABS
1.0000 | ORAL_TABLET | Freq: Four times a day (QID) | ORAL | Status: DC | PRN
  Administered 2021-04-18: 1 via ORAL
  Filled 2021-04-18: qty 1

## 2021-04-18 MED ORDER — TECHNETIUM TC 99M TETROFOSMIN IV KIT
10.3000 | PACK | Freq: Once | INTRAVENOUS | Status: AC | PRN
  Administered 2021-04-18: 10.3 via INTRAVENOUS

## 2021-04-18 NOTE — ED Notes (Signed)
Pt awake and complaining of nausea, no PRN order provider contacted  ?

## 2021-04-18 NOTE — ED Notes (Signed)
Report handed off to Jacquelyn RN. ?

## 2021-04-18 NOTE — ED Notes (Signed)
Floor and bed placement notified that pt will be Dc'd from ED.  ?

## 2021-04-18 NOTE — ED Notes (Signed)
Pt transferred to NM for Stress test.  ?

## 2021-05-01 ENCOUNTER — Encounter: Payer: Self-pay | Admitting: Internal Medicine

## 2021-05-01 ENCOUNTER — Ambulatory Visit (INDEPENDENT_AMBULATORY_CARE_PROVIDER_SITE_OTHER): Payer: Medicaid Other | Admitting: Internal Medicine

## 2021-05-01 VITALS — BP 141/96 | HR 89 | Ht 62.0 in | Wt 192.4 lb

## 2021-05-01 DIAGNOSIS — R0789 Other chest pain: Secondary | ICD-10-CM | POA: Diagnosis not present

## 2021-05-01 DIAGNOSIS — E7800 Pure hypercholesterolemia, unspecified: Secondary | ICD-10-CM | POA: Diagnosis not present

## 2021-05-01 DIAGNOSIS — I10 Essential (primary) hypertension: Secondary | ICD-10-CM

## 2021-05-01 MED ORDER — VALSARTAN-HYDROCHLOROTHIAZIDE 160-25 MG PO TABS: 1.0000 | ORAL_TABLET | Freq: Every day | ORAL | 3 refills | Status: DC

## 2021-05-01 NOTE — Progress Notes (Signed)
? ? ?LIPID CLINIC CONSULT NOTE ? ?Chief Complaint:  ?Chest pain, manage dyslipidemia ? ?Primary Care Physician: ?[facility] ? ?Primary Cardiologist:  ?None ? ?HPI:  ?Toni Vaughn is a 41 y.o. female who is being seen today for the evaluation of chest pain, dyslipidemia at the request of Verlon Au, MD. this is a pleasant 41 year old female kindly referred for evaluation and management of dyslipidemia but also has a second opinion regarding chest pain.  She recently was seen in the emergency department at [facility] with chest pain.  She had intermittent chest heaviness lasting for several minutes over the past several days.  There was some associated shortness of breath and wheezing.  She was noted to have PVCs on her EKG with anterior T wave inversions.  Her potassium however was very low at 2.8.  Subsequently this has been corrected to 4.2 recently.  She was seen by Dr. Rosemary Holms with [facility] cardiovascular care and underwent nuclear stress testing.  This was apparently negative demonstrating a small fixed inferior defect with normal wall motion and normal LVEF 64%.  This finding is more suggestive of artifact.  Since then she has been seen by her primary care provider.  Her symptoms were concerning for possible asthma.  Based on that she was given an inhaler and noted marked improvement in her shortness of breath, chest tightness and wheezing on this inhaler.  I believe that the diagnosis of asthma is actually probably appropriate and probably why she was having chest discomfort.  I would not recommend further cardiac testing at this point for that.  Additionally, her cholesterol has been noted to be very high.  Her most recent lipid profile showed total cholesterol 316, triglycerides 136, HDL 62 and LDL 229.  Its not clear whether this is diet dietary or genetic.  She does report a fairly atherogenic diet and tends to fry a lot of foods.  Of note her blood pressure was elevated  today.  She says that she has had significant improvement in blood pressure on hydrochlorothiazide but blood pressure tends to remain around the 140 -150 systolic over 90s diastolic.  Prior to therapy she was seen blood pressures of 180/120. ? ?PMHx:  ?Past Medical History:  ?Diagnosis Date  ? Depression   ? High cholesterol   ? Rheumatoid arthritis (HCC)   ? ? ?Past Surgical History:  ?Procedure Laterality Date  ? TUBAL LIGATION    ? ? ?FAMHx:  ?Family History  ?Problem Relation Age of Onset  ? Hypertension Mother   ? COPD Mother   ? Emphysema Mother   ? Cervical cancer Mother   ? Mental illness Father   ? ? ?SOCHx:  ? reports that she has never smoked. She has never used smokeless tobacco. She reports current alcohol use of about 1.0 standard drink per week. She reports that she does not use drugs. ? ?ALLERGIES:  ?No Known Allergies ? ?ROS: ?Pertinent items noted in HPI and remainder of comprehensive ROS otherwise negative. ? ?HOME MEDS: ?Current Outpatient Medications on File Prior to Visit  ?Medication Sig Dispense Refill  ? aspirin EC 81 MG EC tablet Take 1 tablet (81 mg total) by mouth daily. Swallow whole. 30 tablet 11  ? atorvastatin (LIPITOR) 40 MG tablet Take 40 mg by mouth daily.    ? cetirizine (ZYRTEC) 10 MG tablet Take 10 mg by mouth daily.    ? folic acid (FOLVITE) 1 MG tablet Take 1 mg by mouth daily.    ?  golimumab 2 mg/kg in sodium chloride 0.9 % Inject 2 mg/kg into the vein every 8 (eight) weeks.    ? leflunomide (ARAVA) 10 MG tablet Take 10 mg by mouth daily.    ? methotrexate 2.5 MG tablet Take 10 mg by mouth once a week. Sundays    ? potassium chloride (MICRO-K) 10 MEQ CR capsule Take by mouth 2 (two) times daily.    ? SYMBICORT 160-4.5 MCG/ACT inhaler SMARTSIG:By Mouth    ? ?No current facility-administered medications on file prior to visit.  ? ? ?LABS/IMAGING: ?No results found for this or any previous visit (from the past 48 hour(s)). ?No results found. ? ?LIPID PANEL: ?No results found  for: CHOL, TRIG, HDL, CHOLHDL, VLDL, LDLCALC, LDLDIRECT ? ?WEIGHTS: ?Wt Readings from Last 3 Encounters:  ?05/01/21 192 lb 6.4 oz (87.3 kg)  ?04/17/21 190 lb (86.2 kg)  ?03/26/21 192 lb 6.4 oz (87.3 kg)  ? ? ?VITALS: ?BP (!) 141/96   Pulse 89   Ht  (1.575 m)   Wt 192 lb 6.4 oz (87.3 kg)   SpO2 99%   BMI 35.19 kg/m?  ? ?EXAM: ?General appearance: alert and no distress ?Neck: no carotid bruit, no JVD, and thyroid not enlarged, symmetric, no tenderness/mass/nodules ?Lungs: clear to auscultation bilaterally ?Heart: regular rate and rhythm, S1, S2 normal, no murmur, click, rub or gallop ?Abdomen: soft, non-tender; bowel sounds normal; no masses,  no organomegaly ?Extremities: extremities normal, atraumatic, no cyanosis or edema ?Pulses: 2+ and symmetric ?Skin: Skin color, texture, turgor normal. No rashes or lesions ?Neurologic: Grossly normal ?Psych: Pleasant ? ?*Examination chaperoned by Julaine Fusi, RN. ? ?EKG: ?Deferred ? ?ASSESSMENT: ?Noncardiac chest pain-low risk Myoview (04/2021) ?Probable asthma with improvement on inhalers ?Marked dyslipidemia with LDL greater than 190,?  Familial hyperlipidemia ?Uncontrolled hypertension ? ?PLAN: ?1.   [name] has likely noncardiac chest pain.  She underwent recent work-up by Dr. Rosemary Holms in the hospital and had a low risk Myoview.  Her abnormalities on EKG likely were due to hypokalemia which has been corrected.  She is currently on potassium supplements however she is only taking the potassium once a day rather than twice a day.  Blood pressure is improved but still not at target.  She will need additional therapy.  I will switch her to valsartan/HCTZ 160/25 mg daily.  We will plan a repeat metabolic profile in about 2 weeks to reassess her renal function as well as her potassium.  With regards to her lipids, she was recently started on atorvastatin but says she is only been taking it daily for about a month.  I would like to then repeat lipids in about 3  months on therapy and we could consider other options at that time.  She may very well have a familial hyperlipidemia however she needs to make significant changes in her diet which is quite atherogenic.  Plan follow-up with me afterwards. ? ?Thanks again for the kind referral. ? ?Chrystie Nose, MD, [facility], FACP  ?[place]  [facility]  ?Medical Director of the Advanced Lipid Disorders &  ?Cardiovascular Risk Reduction Clinic ?Diplomate of the [employer] of Clinical Lipidology ?Attending Cardiologist  ?Direct Dial: [phone]  Fax: [phone]  ?Website:  [email or web address] ? ?Toni Vaughn ?05/01/2021, 4:54 PM ?

## 2021-05-01 NOTE — Patient Instructions (Signed)
Medication Instructions:  ?STOP hydrochlorothiazide  ?START valsartan-hctz 160/25mg  daily ? ?*If you need a refill on your cardiac medications before your next appointment, please call your pharmacy* ? ? ?Lab Work: ?NON-FASTING BMET in about 2 weeks -- around May 4 ? ?FASTING lipid panel in about 3 months -- before next appointment  ? ?If you have labs (blood work) drawn today and your tests are completely normal, you will receive your results only by: ?MyChart Message (if you have MyChart) OR ?A paper copy in the mail ?If you have any lab test that is abnormal or we need to change your treatment, we will call you to review the results. ? ?Follow-Up: ?[facility], you and your health needs are our priority.  As part of our continuing mission to provide you with exceptional heart care, we have created designated Provider Care Teams.  These Care Teams include your primary Cardiologist (physician) and Advanced Practice Providers (APPs -  Physician Assistants and Nurse Practitioners) who all work together to provide you with the care you need, when you need it. ? ?We recommend signing up for the patient portal called "MyChart".  Sign up information is provided on this After Visit Summary.  MyChart is used to connect with patients for Virtual Visits (Telemedicine).  Patients are able to view lab/test results, encounter notes, upcoming appointments, etc.  Non-urgent messages can be sent to your provider as well.   ?To learn more about what you can do with MyChart, go to [email or web address].   ? ?Your next appointment:   ?3 month(s) ? ?The format for your next appointment:   ?In Person ? ?Provider:   ?Zoila ShutterKenneth Hilty MD ?

## 2021-05-02 ENCOUNTER — Ambulatory Visit: Payer: Medicaid Other | Admitting: Internal Medicine

## 2021-05-16 ENCOUNTER — Other Ambulatory Visit: Payer: Self-pay | Admitting: *Deleted

## 2021-05-16 DIAGNOSIS — E876 Hypokalemia: Secondary | ICD-10-CM

## 2021-05-16 LAB — BASIC METABOLIC PANEL
BUN/Creatinine Ratio: 10 (ref 9–23)
BUN: 6 mg/dL (ref 6–20)
CO2: 24 mmol/L (ref 20–29)
Calcium: 9.2 mg/dL (ref 8.7–10.2)
Chloride: 99 mmol/L (ref 96–106)
Creatinine, Ser: 0.6 mg/dL (ref 0.57–1.00)
Glucose: 83 mg/dL (ref 70–99)
Potassium: 3.4 mmol/L — ABNORMAL LOW (ref 3.5–5.2)
Sodium: 141 mmol/L (ref 134–144)
eGFR: 117 mL/min/{1.73_m2} (ref >59)

## 2021-05-16 MED ORDER — POTASSIUM CHLORIDE ER 10 MEQ PO CPCR: 30.0000 meq | ORAL_CAPSULE | Freq: Every day | ORAL | 1 refills | Status: DC

## 2021-06-18 NOTE — Telephone Encounter (Signed)
Toni Vaughn, afraid she is going to need to continue taking some potassium, but she can probably reduce to just 2 of the 10 mEq capsules a day.

## 2021-06-20 ENCOUNTER — Other Ambulatory Visit: Payer: Self-pay | Admitting: *Deleted

## 2021-06-20 DIAGNOSIS — E7800 Pure hypercholesterolemia, unspecified: Secondary | ICD-10-CM

## 2021-06-20 MED ORDER — POTASSIUM CHLORIDE ER 10 MEQ PO CPCR
20.0000 meq | ORAL_CAPSULE | Freq: Every day | ORAL | 1 refills | Status: AC
Start: 2021-06-20 — End: ?

## 2021-07-25 NOTE — Progress Notes (Deleted)
Cardiology Clinic Note   Patient Name: Toni Vaughn Date of Encounter: 07/28/2021  Primary Care Provider:  [facility] Primary Cardiologist:  Chrystie Nose, MD  Patient Profile    Aeriel Vaughn 41 year old female presents to clinic today for follow-up evaluation of her hyperlipidemia and chest discomfort.  Past Medical History    Past Medical History:  Diagnosis Date   Depression    High cholesterol    Rheumatoid arthritis (HCC)    Past Surgical History:  Procedure Laterality Date   TUBAL LIGATION      Allergies  No Known Allergies  History of Present Illness    Toni Vaughn has a PMH of noncardiac chest pain, and hyperlipidemia.  She was initially referred by her PCP for evaluation and management of her hyperlipidemia.  She had been seen in the emergency department.  She was noting intermittent periods of chest heaviness that would last for several minutes over several days.  She did note some associated shortness of breath and wheezing.  She was noted to have PVCs on her EKG with anterior T wave inversions.  Her potassium was 2.8.  She received supplemental potassium.  She was seen by Dr.Patwardhan with [place] [facility].  She underwent stress testing.  This was reportedly negative.  She was noted to have a fixed inferior defect and normal wall function with normal LVEF of 64%.  Her findings were more suggestive of artifact.  Her symptoms are concerning for asthma.  She was prescribed an inhaler and noted significant improvement with her shortness of breath and chest tightness and wheezing.  She was diagnosed with asthma.  No further cardiac testing was recommended.  Her cholesterol was noted to be 316 with an LDL cholesterol of 229.  There was some question as to whether her cholesterol was late related to diet or was genetic.  She reported eating a lot of fried type foods.  Her blood pressure was elevated during evaluation however, she was noted  to have significant improvement with her blood pressure with the addition of HCTZ.  Her blood pressure remained in the 140-150 systolic range over 90s diastolic.  Prior to HCTZ her blood pressure was noted to be 180s over 120s.  She presents to the clinic today for follow-up evaluation states***  *** denies chest pain, shortness of breath, lower extremity edema, fatigue, palpitations, melena, hematuria, hemoptysis, diaphoresis, weakness, presyncope, syncope, orthopnea, and PND.  Hyperlipidemia-LDL*** Continue atorvastatin Heart healthy low-sodium diet-salty 6 given Increase physical activity as tolerated Repeat fasting lipids and LFTs  Essential hypertension-BP today ***. Continue valsartan, HCTZ Heart healthy low-sodium diet-salty 6 given Increase physical activity as tolerated  Chest discomfort-previously felt to be related to asthma.  No significant or recent episodes of chest discomfort. Continue inhaler use. Follows with PCP  Disposition: Follow-up with Dr. Rennis Golden in 9-12 months.  Home Medications    Prior to Admission medications   Medication Sig Start Date End Date Taking? Authorizing Provider  aspirin EC 81 MG EC tablet Take 1 tablet (81 mg total) by mouth daily. Swallow whole. 04/19/21   Rolly Salter, MD  atorvastatin (LIPITOR) 40 MG tablet Take 40 mg by mouth daily.    [provider]  cetirizine (ZYRTEC) 10 MG tablet Take 10 mg by mouth daily. 04/27/19   [provider]  folic acid (FOLVITE) 1 MG tablet Take 1 mg by mouth daily. 12/28/20   [provider]  golimumab 2 mg/kg in sodium chloride 0.9 %  Inject 2 mg/kg into the vein every 8 (eight) weeks.    [provider]  leflunomide (ARAVA) 10 MG tablet Take 10 mg by mouth daily. 09/04/19   [provider]  methotrexate 2.5 MG tablet Take 10 mg by mouth once a week. Sundays 11/05/20   [provider]  potassium chloride (MICRO-K) 10 MEQ CR capsule Take 2 capsules (20 mEq  total) by mouth daily. 06/20/21   Hilty, Lisette Abu, MD  SYMBICORT 160-4.5 MCG/ACT inhaler SMARTSIG:By Mouth 04/24/21   [provider]  valsartan-hydrochlorothiazide (DIOVAN-HCT) 160-25 MG tablet Take 1 tablet by mouth daily. 05/01/21   Hilty, Lisette Abu, MD    Family History    Family History  Problem Relation Age of Onset   Hypertension Mother    COPD Mother    Emphysema Mother    Cervical cancer Mother    Mental illness Father    She indicated that her mother is alive. She indicated that her father is alive.  Social History    Social History   Socioeconomic History   Marital status: Single    Spouse name: Not on file   Number of children: Not on file   Years of education: Not on file   Highest education level: Not on file  Occupational History   Not on file  Tobacco Use   Smoking status: Never   Smokeless tobacco: Never  Vaping Use   Vaping Use: Never used  Substance and Sexual Activity   Alcohol use: Yes    Alcohol/week: 1.0 standard drink of alcohol    Types: 1 Standard drinks or equivalent per week   Drug use: Never   Sexual activity: Yes    Partners: Male    Birth control/protection: Condom, Surgical  Other Topics Concern   Not on file  Social History Narrative   Not on file   Social Determinants of Health   Financial Resource Strain: Not on file  Food Insecurity: Not on file  Transportation Needs: Not on file  Physical Activity: Not on file  Stress: Not on file  Social Connections: Not on file  Intimate Partner Violence: Not on file     Review of Systems    General:  No chills, fever, night sweats or weight changes.  Cardiovascular:  No chest pain, dyspnea on exertion, edema, orthopnea, palpitations, paroxysmal nocturnal dyspnea. Dermatological: No rash, lesions/masses Respiratory: No cough, dyspnea Urologic: No hematuria, dysuria Abdominal:   No nausea, vomiting, diarrhea, bright red blood per rectum, melena, or hematemesis Neurologic:   No visual changes, wkns, changes in mental status. All other systems reviewed and are otherwise negative except as noted above.  Physical Exam    VS:  There were no vitals taken for this visit. , BMI There is no height or weight on file to calculate BMI. GEN: Well nourished, well developed, in no acute distress. HEENT: normal. Neck: Supple, no JVD, carotid bruits, or masses. Cardiac: RRR, no murmurs, rubs, or gallops. No clubbing, cyanosis, edema.  Radials/DP/PT 2+ and equal bilaterally.  Respiratory:  Respirations regular and unlabored, clear to auscultation bilaterally. GI: Soft, nontender, nondistended, BS + x 4. MS: no deformity or atrophy. Skin: warm and dry, no rash. Neuro:  Strength and sensation are intact. Psych: Normal affect.  Accessory Clinical Findings    Recent Labs: 04/18/2021: ALT 24; Hemoglobin 11.1; Magnesium 1.6; Platelets 162 05/15/2021: BUN 6; Creatinine, Ser 0.60; Potassium 3.4; Sodium 141   Recent Lipid Panel No results found for: "CHOL", "TRIG", "HDL", "CHOLHDL", "  VLDL", "LDLCALC", "LDLDIRECT"  ECG personally reviewed by me today- *** - No acute changes  EKG 04/18/2021 Sinus rhythm, nonspecific T wave abnormality 82 bpm  Assessment & Plan   1.  ***   Thomasene Ripple. Mckaela Howley NP-C     07/28/2021, 1:37 PM [facility] [address] Office [phone] Fax [phone]  Notice: This dictation was prepared with Dragon dictation along with smaller phrase technology. Any transcriptional errors that result from this process are unintentional and may not be corrected upon review.  I spent***minutes examining this patient, reviewing medications, and using patient centered shared decision making involving her cardiac care.  Prior to her visit I spent greater than 20 minutes reviewing her past medical history,  medications, and prior cardiac tests.

## 2021-07-28 ENCOUNTER — Ambulatory Visit: Payer: Medicaid Other | Admitting: General Practice

## 2021-08-01 ENCOUNTER — Encounter: Payer: Self-pay | Admitting: General Practice

## 2021-10-26 ENCOUNTER — Encounter: Payer: Self-pay | Admitting: Obstetrics and Gynecology

## 2021-11-03 ENCOUNTER — Ambulatory Visit: Payer: Medicaid Other

## 2021-11-10 ENCOUNTER — Other Ambulatory Visit (HOSPITAL_COMMUNITY)
Admission: RE | Admit: 2021-11-10 | Discharge: 2021-11-10 | Disposition: A | Discharge status: Home / Self Care | Payer: Medicaid Other | Source: Ambulatory Visit | Attending: Obstetrics and Gynecology | Admitting: Obstetrics and Gynecology

## 2021-11-10 ENCOUNTER — Ambulatory Visit (INDEPENDENT_AMBULATORY_CARE_PROVIDER_SITE_OTHER): Payer: Medicaid Other | Admitting: General Practice

## 2021-11-10 VITALS — BP 150/100 | HR 83 | Ht 62.0 in | Wt 194.2 lb

## 2021-11-10 DIAGNOSIS — Z202 Contact with and (suspected) exposure to infections with a predominantly sexual mode of transmission: Secondary | ICD-10-CM

## 2021-11-10 NOTE — Progress Notes (Signed)
SUBJECTIVE:  41 y.o. female presents for STD testing.  Denies abnormal vaginal bleeding or significant pelvic pain or fever. No UTI symptoms. Denies history of known exposure to STD.  No LMP recorded.  OBJECTIVE:  She appears well, afebrile. Urine dipstick: not done.  ASSESSMENT:  Vaginal Discharge  Vaginal Odor   PLAN:  GC, chlamydia, trichomonas, BVAG, CVAG probe sent to lab. Treatment: To be determined once lab results are received ROV prn if symptoms persist or worsen.

## 2021-11-11 LAB — CERVICOVAGINAL ANCILLARY ONLY
Bacterial Vaginitis (gardnerella): NEGATIVE
Candida Glabrata: NEGATIVE
Candida Vaginitis: POSITIVE — AB
Chlamydia: NEGATIVE
Comment: NEGATIVE
Comment: NEGATIVE
Comment: NEGATIVE
Comment: NEGATIVE
Comment: NEGATIVE
Comment: NORMAL
Neisseria Gonorrhea: NEGATIVE
Trichomonas: NEGATIVE

## 2021-11-12 ENCOUNTER — Other Ambulatory Visit: Payer: Self-pay | Admitting: *Deleted

## 2021-11-12 DIAGNOSIS — B3731 Acute candidiasis of vulva and vagina: Secondary | ICD-10-CM

## 2021-11-12 MED ORDER — FLUCONAZOLE 150 MG PO TABS: 150.0000 mg | ORAL_TABLET | Freq: Once | ORAL | 0 refills | Status: AC

## 2021-11-12 NOTE — Progress Notes (Signed)
TC from pt regarding +yeast on vaginal swab. RX Diflucan per protocol.

## 2021-11-14 MED ORDER — FLUCONAZOLE 150 MG PO TABS: 150.0000 mg | ORAL_TABLET | Freq: Once | ORAL | 0 refills | Status: AC

## 2021-11-14 NOTE — Addendum Note (Signed)
Addended by: Mora Bellman on: 11/14/2021 12:06 PM   Modules accepted: Orders

## 2022-01-21 ENCOUNTER — Encounter: Payer: Self-pay | Admitting: Physician Assistant

## 2022-02-10 ENCOUNTER — Ambulatory Visit: Payer: Medicaid Other | Admitting: Physician Assistant

## 2022-05-29 ENCOUNTER — Ambulatory Visit: Payer: Medicaid Other | Admitting: Obstetrics

## 2022-06-26 ENCOUNTER — Ambulatory Visit (INDEPENDENT_AMBULATORY_CARE_PROVIDER_SITE_OTHER): Payer: Medicaid Other | Admitting: Obstetrics

## 2022-06-26 ENCOUNTER — Encounter: Payer: Self-pay | Admitting: Obstetrics

## 2022-06-26 ENCOUNTER — Other Ambulatory Visit (HOSPITAL_COMMUNITY)
Admission: RE | Admit: 2022-06-26 | Discharge: 2022-06-26 | Disposition: A | Discharge status: Home / Self Care | Payer: Medicaid Other | Source: Ambulatory Visit | Attending: Obstetrics | Admitting: Obstetrics

## 2022-06-26 VITALS — BP 126/89 | HR 82 | Ht 62.0 in | Wt 189.0 lb

## 2022-06-26 DIAGNOSIS — Z1239 Encounter for other screening for malignant neoplasm of breast: Secondary | ICD-10-CM | POA: Diagnosis not present

## 2022-06-26 DIAGNOSIS — N898 Other specified noninflammatory disorders of vagina: Secondary | ICD-10-CM | POA: Diagnosis not present

## 2022-06-26 DIAGNOSIS — Z01419 Encounter for gynecological examination (general) (routine) without abnormal findings: Secondary | ICD-10-CM | POA: Diagnosis not present

## 2022-06-26 DIAGNOSIS — I10 Essential (primary) hypertension: Secondary | ICD-10-CM

## 2022-06-26 DIAGNOSIS — N946 Dysmenorrhea, unspecified: Secondary | ICD-10-CM | POA: Diagnosis not present

## 2022-06-26 DIAGNOSIS — Z1339 Encounter for screening examination for other mental health and behavioral disorders: Secondary | ICD-10-CM | POA: Diagnosis not present

## 2022-06-26 DIAGNOSIS — E569 Vitamin deficiency, unspecified: Secondary | ICD-10-CM

## 2022-06-26 DIAGNOSIS — J301 Allergic rhinitis due to pollen: Secondary | ICD-10-CM

## 2022-06-26 DIAGNOSIS — Z113 Encounter for screening for infections with a predominantly sexual mode of transmission: Secondary | ICD-10-CM

## 2022-06-26 MED ORDER — IBUPROFEN 800 MG PO TABS: 800.0000 mg | ORAL_TABLET | Freq: Three times a day (TID) | ORAL | 5 refills | Status: DC | PRN

## 2022-06-26 MED ORDER — CETIRIZINE HCL 10 MG PO TABS: 10.0000 mg | ORAL_TABLET | Freq: Every day | ORAL | 11 refills | Status: DC

## 2022-06-26 MED ORDER — VITAFOL ULTRA 29-0.6-0.4-200 MG PO CAPS: 1.0000 | ORAL_CAPSULE | Freq: Every day | ORAL | 4 refills | Status: DC

## 2022-06-26 NOTE — Progress Notes (Signed)
Subjective:        Toni Vaughn is a 42 y.o. female here for a routine exam.  Current complaints: Vaginal discharge.    Personal health questionnaire:  Is patient Toni Vaughn, have a family history of breast and/or ovarian cancer: no Is there a family history of uterine cancer diagnosed at age < 92, gastrointestinal cancer, urinary tract cancer, family member who is a Personnel officer syndrome-associated carrier: no Is the patient overweight and hypertensive, family history of diabetes, personal history of gestational diabetes, preeclampsia or PCOS: no Is patient over 95, have PCOS,  family history of premature CHD under age 53, diabetes, smoke, have hypertension or peripheral artery disease:  no At any time, has a partner hit, kicked or otherwise hurt or frightened you?: no Over the past 2 weeks, have you felt down, depressed or hopeless?: no Over the past 2 weeks, have you felt little interest or pleasure in doing things?:no   Gynecologic History Patient's last menstrual period was 06/11/2022 (exact date). Contraception: tubal ligation Last Pap: 2022. Results were: abnormal Last mammogram: n/a. Results were: n/a  Obstetric History OB History  Gravida Para Term Preterm AB Living  5 3 3  0 2 3  SAB IAB Ectopic Multiple Live Births  0 0 0 0 3    # Outcome Date GA Lbr Len/2nd Weight Sex Delivery Anes PTL Lv  5 Term 12/18/04 [redacted]w[redacted]d   F Vag-Spont   LIV  4 Term 03/01/02 [redacted]w[redacted]d   M Vag-Spont   LIV  3 Term 01/08/01 [redacted]w[redacted]d   F Vag-Spont   LIV  2 AB           1 AB             Past Medical History:  Diagnosis Date   Depression    High blood pressure 03/2021   High cholesterol    Rheumatoid arthritis (HCC)     Past Surgical History:  Procedure Laterality Date   TUBAL LIGATION       Current Outpatient Medications:    cetirizine (ZYRTEC ALLERGY) 10 MG tablet, Take 1 tablet (10 mg total) by mouth daily., Disp: 30 tablet, Rfl: 11   ibuprofen (ADVIL) 800 MG tablet, Take 1 tablet (800 mg  total) by mouth every 8 (eight) hours as needed., Disp: 30 tablet, Rfl: 5   Prenat-Fe Poly-Methfol-FA-DHA (VITAFOL ULTRA) 29-0.6-0.4-200 MG CAPS, Take 1 capsule by mouth daily before breakfast., Disp: 90 capsule, Rfl: 4   aspirin EC 81 MG EC tablet, Take 1 tablet (81 mg total) by mouth daily. Swallow whole., Disp: 30 tablet, Rfl: 11   atorvastatin (LIPITOR) 40 MG tablet, Take 40 mg by mouth daily., Disp: , Rfl:    cetirizine (ZYRTEC) 10 MG tablet, Take 10 mg by mouth daily., Disp: , Rfl:    golimumab 2 mg/kg in sodium chloride 0.9 %, Inject 2 mg/kg into the vein every 8 (eight) weeks., Disp: , Rfl:    leflunomide (ARAVA) 10 MG tablet, Take 10 mg by mouth daily., Disp: , Rfl:    methotrexate 2.5 MG tablet, Take 10 mg by mouth once a week. Sundays (Patient not taking: Reported on 11/10/2021), Disp: , Rfl:    potassium chloride (MICRO-K) 10 MEQ CR capsule, Take 2 capsules (20 mEq total) by mouth daily., Disp: 180 capsule, Rfl: 1   SYMBICORT 160-4.5 MCG/ACT inhaler, SMARTSIG:By Mouth, Disp: , Rfl:    valsartan-hydrochlorothiazide (DIOVAN-HCT) 160-25 MG tablet, Take 1 tablet by mouth daily., Disp: 90 tablet, Rfl: 3 No Known Allergies  Social History   Tobacco Use   Smoking status: Never   Smokeless tobacco: Never  Substance Use Topics   Alcohol use: Yes    Alcohol/week: 1.0 standard drink of alcohol    Types: 1 Standard drinks or equivalent per week    Comment: sometimes    Family History  Problem Relation Age of Onset   Hypertension Mother    COPD Mother    Emphysema Mother    Cervical cancer Mother    Mental illness Father       Review of Systems  Constitutional: negative for fatigue and weight loss Respiratory: negative for cough and wheezing Cardiovascular: negative for chest pain, fatigue and palpitations Gastrointestinal: negative for abdominal pain and change in bowel habits Musculoskeletal:negative for myalgias Neurological: negative for gait problems and  tremors Behavioral/Psych: negative for abusive relationship, depression Endocrine: negative for temperature intolerance    Genitourinary: positive for vaginal discharge.  negative for abnormal menstrual periods, genital lesions, hot flashes, sexual problems  Integument/breast: negative for breast lump, breast tenderness, nipple discharge and skin lesion(s)    Objective:       BP 126/89 (BP Location: Right Arm, Cuff Size: Large)   Pulse 82   Ht 5\' 2"  (1.575 m)   Wt 189 lb (85.7 kg)   LMP 06/11/2022 (Exact Date)   BMI 34.57 kg/m  General:   Alert and no distress  Skin:   no rash or abnormalities  Lungs:   clear to auscultation bilaterally  Heart:   regular rate and rhythm, S1, S2 normal, no murmur, click, rub or gallop  Breasts:   normal without suspicious masses, skin or nipple changes or axillary nodes  Abdomen:  normal findings: no organomegaly, soft, non-tender and no hernia  Pelvis:  External genitalia: normal general appearance Urinary system: urethral meatus normal and bladder without fullness, nontender Vaginal: normal without tenderness, induration or masses Cervix: normal appearance Adnexa: normal bimanual exam Uterus: anteverted and non-tender, normal size   Lab Review Urine pregnancy test Labs reviewed yes Radiologic studies reviewed no  I have spent a total of 20 minutes of face-to-face time, excluding clinical staff time, reviewing notes and preparing to see patient, ordering tests and/or medications, and counseling the patient.   Assessment:    1. Encounter for gynecological examination with Papanicolaou smear of cervix Rx: - Cytology - PAP( [place])  2. Dysmenorrhea Rx: - ibuprofen (ADVIL) 800 MG tablet; Take 1 tablet (800 mg total) by mouth every 8 (eight) hours as needed.  Dispense: 30 tablet; Refill: 5  3. Vaginal discharge Rx: - Cervicovaginal ancillary only( [place])  4. Screen for STD (sexually transmitted disease) Rx: - HIV antibody  (with reflex) - Hepatitis C Antibody - Hepatitis B Surface AntiGEN - RPR  5. Screening breast examination Rx: - MM Digital Screening; Future  6. Seasonal allergic rhinitis due to pollen Rx: - cetirizine (ZYRTEC ALLERGY) 10 MG tablet; Take 1 tablet (10 mg total) by mouth daily.  Dispense: 30 tablet; Refill: 11  7. Vitamin deficiency Rx: - Prenat-Fe Poly-Methfol-FA-DHA (VITAFOL ULTRA) 29-0.6-0.4-200 MG CAPS; Take 1 capsule by mouth daily before breakfast.  Dispense: 90 capsule; Refill: 4  8. HTN (hypertension), benign - uncontrolled.  Managed by PCP.     Plan:    Education reviewed: calcium supplements, depression evaluation, low fat, low cholesterol diet, safe sex/STD prevention, self breast exams, and weight bearing exercise. Mammogram ordered. Follow up in: 1 year.   Meds ordered this encounter  Medications   cetirizine (ZYRTEC  ALLERGY) 10 MG tablet    Sig: Take 1 tablet (10 mg total) by mouth daily.    Dispense:  30 tablet    Refill:  11   Prenat-Fe Poly-Methfol-FA-DHA (VITAFOL ULTRA) 29-0.6-0.4-200 MG CAPS    Sig: Take 1 capsule by mouth daily before breakfast.    Dispense:  90 capsule    Refill:  4   ibuprofen (ADVIL) 800 MG tablet    Sig: Take 1 tablet (800 mg total) by mouth every 8 (eight) hours as needed.    Dispense:  30 tablet    Refill:  5   Orders Placed This Encounter  Procedures   MM Digital Screening    Standing Status:   Future    Standing Expiration Date:   06/26/2023    Order Specific Question:   Reason for Exam (SYMPTOM  OR DIAGNOSIS REQUIRED)    Answer:   Screening    Order Specific Question:   Is the patient pregnant?    Answer:   No    Order Specific Question:   Preferred imaging location?    Answer:   [facility]   HIV antibody (with reflex)   Hepatitis C Antibody   Hepatitis B Surface AntiGEN   RPR     Brock Bad, MD 06/26/2022 9:21 AM

## 2022-06-27 LAB — HIV ANTIBODY (ROUTINE TESTING W REFLEX): HIV Screen 4th Generation wRfx: NONREACTIVE

## 2022-06-27 LAB — HEPATITIS C ANTIBODY: Hep C Virus Ab: NONREACTIVE

## 2022-06-27 LAB — RPR: RPR Ser Ql: NONREACTIVE

## 2022-06-27 LAB — HEPATITIS B SURFACE ANTIGEN: Hepatitis B Surface Ag: NEGATIVE

## 2022-06-29 LAB — CERVICOVAGINAL ANCILLARY ONLY
Bacterial Vaginitis (gardnerella): NEGATIVE
Candida Glabrata: NEGATIVE
Candida Vaginitis: NEGATIVE
Chlamydia: NEGATIVE
Comment: NEGATIVE
Comment: NEGATIVE
Comment: NEGATIVE
Comment: NEGATIVE
Comment: NEGATIVE
Comment: NORMAL
Neisseria Gonorrhea: NEGATIVE
Trichomonas: NEGATIVE

## 2022-06-30 LAB — CYTOLOGY - PAP
Comment: NEGATIVE
Diagnosis: NEGATIVE
High risk HPV: NEGATIVE

## 2022-07-17 ENCOUNTER — Ambulatory Visit: Payer: Medicaid Other

## 2023-01-25 ENCOUNTER — Ambulatory Visit: Payer: Medicaid Other

## 2023-01-25 ENCOUNTER — Other Ambulatory Visit (HOSPITAL_COMMUNITY)
Admission: RE | Admit: 2023-01-25 | Discharge: 2023-01-25 | Disposition: A | Discharge status: Home / Self Care | Payer: Medicaid Other | Source: Ambulatory Visit | Attending: Obstetrics and Gynecology | Admitting: Obstetrics and Gynecology

## 2023-01-25 DIAGNOSIS — N898 Other specified noninflammatory disorders of vagina: Secondary | ICD-10-CM

## 2023-01-25 MED ORDER — FLUCONAZOLE 150 MG PO TABS: 150.0000 mg | ORAL_TABLET | Freq: Once | ORAL | 1 refills | Status: AC

## 2023-01-25 NOTE — Progress Notes (Signed)
 SUBJECTIVE:  43 y.o. female complains of vaginal itching. December, was placed on amoxicillin  for URI, was then given fluconazole   and now is having increased vaginal itching  Denies abnormal vaginal bleeding or significant pelvic pain or fever.Denies history of known exposure to STD. Pt requesting to be tested for STD also on swab  No LMP recorded.  OBJECTIVE:  She appears alert, well appearing, in no apparent distress   ASSESSMENT:  Vaginal Itching   PLAN:  STD, BVAG, CVAG probe  sent to lab. Treatment: We will treat for Yeast with the vaginal itching per protocol.  ROV prn if symptoms persist or worsen.

## 2023-01-26 LAB — CERVICOVAGINAL ANCILLARY ONLY
Bacterial Vaginitis (gardnerella): NEGATIVE
Candida Glabrata: NEGATIVE
Candida Vaginitis: NEGATIVE
Chlamydia: NEGATIVE
Comment: NEGATIVE
Comment: NEGATIVE
Comment: NEGATIVE
Comment: NEGATIVE
Comment: NEGATIVE
Comment: NORMAL
Neisseria Gonorrhea: NEGATIVE
Trichomonas: NEGATIVE

## 2023-05-18 ENCOUNTER — Other Ambulatory Visit: Payer: Self-pay

## 2023-05-18 MED ORDER — FLUCONAZOLE 150 MG PO TABS: 150.0000 mg | ORAL_TABLET | Freq: Once | ORAL | 0 refills | Status: AC

## 2023-05-18 NOTE — Progress Notes (Signed)
 Diflucan  rx sent per protocol for pt yeast symptoms

## 2023-06-28 ENCOUNTER — Ambulatory Visit: Admitting: Obstetrics and Gynecology

## 2023-06-29 ENCOUNTER — Ambulatory Visit: Admitting: Advanced Practice Midwife

## 2023-06-29 ENCOUNTER — Encounter: Payer: Self-pay | Admitting: Advanced Practice Midwife

## 2023-06-29 ENCOUNTER — Other Ambulatory Visit (HOSPITAL_COMMUNITY)
Admission: RE | Admit: 2023-06-29 | Discharge: 2023-06-29 | Disposition: A | Discharge status: Home / Self Care | Source: Ambulatory Visit | Attending: Advanced Practice Midwife | Admitting: Advanced Practice Midwife

## 2023-06-29 VITALS — BP 126/89 | HR 89 | Ht 62.0 in | Wt 179.3 lb

## 2023-06-29 DIAGNOSIS — Z113 Encounter for screening for infections with a predominantly sexual mode of transmission: Secondary | ICD-10-CM | POA: Insufficient documentation

## 2023-06-29 DIAGNOSIS — Z01419 Encounter for gynecological examination (general) (routine) without abnormal findings: Secondary | ICD-10-CM | POA: Diagnosis not present

## 2023-06-29 DIAGNOSIS — Z1331 Encounter for screening for depression: Secondary | ICD-10-CM

## 2023-06-29 DIAGNOSIS — Z3042 Encounter for surveillance of injectable contraceptive: Secondary | ICD-10-CM | POA: Insufficient documentation

## 2023-06-29 DIAGNOSIS — Z1231 Encounter for screening mammogram for malignant neoplasm of breast: Secondary | ICD-10-CM

## 2023-06-29 DIAGNOSIS — F3281 Premenstrual dysphoric disorder: Secondary | ICD-10-CM | POA: Diagnosis not present

## 2023-06-29 MED ORDER — MEDROXYPROGESTERONE ACETATE 150 MG/ML IM SUSP
150.0000 mg | Freq: Once | INTRAMUSCULAR | Status: AC
  Administered 2023-06-29: 150 mg via INTRAMUSCULAR

## 2023-06-29 MED ORDER — MEDROXYPROGESTERONE ACETATE 150 MG/ML IM SUSP
150.0000 mg | Freq: Once | INTRAMUSCULAR | Status: AC
Start: 2023-06-29 — End: ?

## 2023-06-29 MED ORDER — MEDROXYPROGESTERONE ACETATE 150 MG/ML IM SUSP: 150.0000 mg | INTRAMUSCULAR | 4 refills | Status: DC

## 2023-06-29 MED ORDER — MEDROXYPROGESTERONE ACETATE 150 MG/ML IM SUSP: 150.0000 mg | INTRAMUSCULAR | 0 refills | Status: DC

## 2023-06-29 NOTE — Progress Notes (Signed)
 Has concerns about increased depression (PMDD) when it's close to starting cycle. No pap today, but requests all STI testing. PHA-9 and GAD & score 14.

## 2023-06-29 NOTE — Progress Notes (Signed)
 Subjective:     Toni Vaughn is a 43 y.o. female here at [facility] for a routine exam.  Current complaints: cycles of sudden onset mood swings/depression before monthly menses.  Symptoms resolve by day 2 of menses.  Personal and family health history reviewed: yes.  Do you have a primary care provider? yes Do you feel safe at home? yes  Flowsheet Row Office Visit from [facility]/29/2023 in [facility] Total Score 3    Health Maintenance Due  Topic Date Due   COVID-19 Vaccine (1) Never done   HPV VACCINES (1 - 3-dose series) Never done   DTaP/Tdap/Td (1 - Tdap) Never done   Pneumococcal Vaccine 82-13 Years old (1 of 2 - PCV) Never done     Risk factors for chronic health problems: Smoking: never Alchohol/how much: occasional Pt BMI: Body mass index is 32.79 kg/m.   Gynecologic History Patient's last menstrual period was 06/06/2023 (exact date). Contraception: none Last Pap: 06/26/22. Results were: normal Last mammogram: n/a.   Obstetric History OB History  Gravida Para Term Preterm AB Living  5 3 3  0 2 3  SAB IAB Ectopic Multiple Live Births  0 0 0 0 3    # Outcome Date GA Lbr Len/2nd Weight Sex Type Anes PTL Lv  5 Term 12/18/04 [redacted]w[redacted]d   F Vag-Spont   LIV  4 Term 03/01/02 [redacted]w[redacted]d   M Vag-Spont   LIV  3 Term 01/08/01 [redacted]w[redacted]d   F Vag-Spont   LIV  2 AB           1 AB              The following portions of the patient's history were reviewed and updated as appropriate: allergies, current medications, past family history, past medical history, past social history, past surgical history, and problem list.  Review of Systems Pertinent items noted in HPI and remainder of comprehensive ROS otherwise negative.    Objective:   Today's Vitals   06/29/23 1303  BP: 126/89  Pulse: 89  Weight: 179 lb 4.8 oz (81.3 kg)  Height: 5' 2 (1.575 m)   Body mass index is 32.79 kg/m.  VS reviewed, nursing note reviewed,  Constitutional: well  developed, well nourished, no distress HEENT: normocephalic, thyroid without enlargement or mass HEART: RRR, no murmurs rubs/gallops RESP: clear and equal to auscultation bilaterally in all lobes  Breast Exam:  exam performed: right breast normal without mass, skin or nipple changes or axillary nodes, left breast normal without mass, skin or nipple changes or axillary nodes Abdomen: soft Neuro: alert and oriented x 3 Skin: warm, dry Psych: affect normal Pelvic exam: Deferred     Assessment/Plan:   1. Routine screening for STI (sexually transmitted infection) (Primary)  - Cervicovaginal ancillary only - Hepatitis B surface antigen - Hepatitis C antibody - HIV Antibody (routine testing w rflx) - RPR  2. PMDD (premenstrual dysphoric disorder) --Reviewed tx options, including SSRIs and OCPs.  Pt prefers Depo, which she has used before.  Will initiate today, and f/u in 3 months.  - medroxyPROGESTERone (DEPO-PROVERA) 150 MG/ML injection; Inject 1 mL (150 mg total) into the muscle every 3 (three) months.  Dispense: 1 mL; Refill: 0 - TSH - medroxyPROGESTERone (DEPO-PROVERA) 150 MG/ML injection; Inject 1 mL (150 mg total) into the muscle every 3 (three) months.  Dispense: 1 mL; Refill: 4  3. Positive screening for depression on 9-item Patient Health Questionnaire (PHQ-9)  -  Amb ref to [facility]  4. Encounter for Depo-Provera contraception  - medroxyPROGESTERone (DEPO-PROVERA) injection 150 mg      Return in about 3 months (around 09/29/2023) for Gyn follow up for contraception.   Arlester Bence, CNM 3:16 PM

## 2023-06-30 LAB — TSH: TSH: 0.69 u[IU]/mL (ref 0.450–4.500)

## 2023-06-30 LAB — CERVICOVAGINAL ANCILLARY ONLY
Chlamydia: NEGATIVE
Comment: NEGATIVE
Comment: NEGATIVE
Comment: NORMAL
Neisseria Gonorrhea: NEGATIVE
Trichomonas: NEGATIVE

## 2023-06-30 LAB — RPR: RPR Ser Ql: NONREACTIVE

## 2023-06-30 LAB — HEPATITIS B SURFACE ANTIGEN: Hepatitis B Surface Ag: NEGATIVE

## 2023-06-30 LAB — HIV ANTIBODY (ROUTINE TESTING W REFLEX): HIV Screen 4th Generation wRfx: NONREACTIVE

## 2023-06-30 LAB — HEPATITIS C ANTIBODY: Hep C Virus Ab: NONREACTIVE

## 2023-07-08 ENCOUNTER — Other Ambulatory Visit: Payer: Self-pay | Admitting: Podiatry

## 2023-07-08 ENCOUNTER — Encounter: Payer: Self-pay | Admitting: Podiatry

## 2023-07-08 ENCOUNTER — Ambulatory Visit

## 2023-07-08 ENCOUNTER — Ambulatory Visit (INDEPENDENT_AMBULATORY_CARE_PROVIDER_SITE_OTHER): Admitting: Podiatry

## 2023-07-08 DIAGNOSIS — M19071 Primary osteoarthritis, right ankle and foot: Secondary | ICD-10-CM

## 2023-07-08 DIAGNOSIS — M722 Plantar fascial fibromatosis: Secondary | ICD-10-CM

## 2023-07-08 MED ORDER — TRIAMCINOLONE ACETONIDE 40 MG/ML IJ SUSP
20.0000 mg | Freq: Once | INTRAMUSCULAR | Status: AC
  Administered 2023-07-08: 20 mg

## 2023-07-09 NOTE — Progress Notes (Signed)
 Subjective:  Patient ID: Toni Vaughn, female    DOB: Mar 06, 1980,  MRN: [id number] HPI Chief Complaint  Patient presents with   Foot Pain    Dorsal midfoot right - knot, swelling x 6-7 months, same issue on left, does have RA, takes meds as she should, tried pain patch and arch brace   New Patient (Initial Visit)    Est pt 02/2020    43 y.o. female presents with the above complaint.   ROS: Denies fever chills nausea vomit muscle aches pains calf pain back pain chest pain shortness of breath.  States that her long-term RA has started to get worse states that the left foot has come down but is now in the right foot.  She states that this swelling has been present for several months.  Past Medical History:  Diagnosis Date   Depression    High blood pressure 03/2021   High cholesterol    Rheumatoid arthritis (HCC)    Past Surgical History:  Procedure Laterality Date   TUBAL LIGATION      Current Outpatient Medications:    cetirizine  (ZYRTEC  ALLERGY) 10 MG tablet, Take 1 tablet (10 mg total) by mouth daily., Disp: 30 tablet, Rfl: 11   cetirizine  (ZYRTEC ) 10 MG tablet, Take 10 mg by mouth daily. (Patient not taking: Reported on 06/29/2023), Disp: , Rfl:    golimumab 2 mg/kg in sodium chloride 0.9 %, Inject 2 mg/kg into the vein every 8 (eight) weeks., Disp: , Rfl:    ibuprofen  (ADVIL ) 800 MG tablet, Take 1 tablet (800 mg total) by mouth every 8 (eight) hours as needed., Disp: 30 tablet, Rfl: 5   leflunomide  (ARAVA ) 10 MG tablet, Take 10 mg by mouth daily., Disp: , Rfl:    medroxyPROGESTERone  (DEPO-PROVERA ) 150 MG/ML injection, Inject 1 mL (150 mg total) into the muscle every 3 (three) months., Disp: 1 mL, Rfl: 0   medroxyPROGESTERone  (DEPO-PROVERA ) 150 MG/ML injection, Inject 1 mL (150 mg total) into the muscle every 3 (three) months., Disp: 1 mL, Rfl: 4  Current Facility-Administered Medications:    medroxyPROGESTERone  (DEPO-PROVERA ) injection 150 mg, 150 mg, Intramuscular, Once,    No Known Allergies Review of Systems Objective:  There were no vitals filed for this visit.  General: Well developed, nourished, in no acute distress, alert and oriented x3   Dermatological: Skin is warm, dry and supple bilateral. Nails x 10 are well maintained; remaining integument appears unremarkable at this time. There are no open sores, no preulcerative lesions, no rash or signs of infection present.  Vascular: Dorsalis Pedis artery and Posterior Tibial artery pedal pulses are 2/4 bilateral with immedate capillary fill time. Pedal hair growth present. No varicosities and no lower extremity edema present bilateral.   Neruologic: Grossly intact via light touch bilateral. Vibratory intact via tuning fork bilateral. Protective threshold with Semmes Wienstein monofilament intact to all pedal sites bilateral. Patellar and Achilles deep tendon reflexes 2+ bilateral. No Babinski or clonus noted bilateral.   Musculoskeletal: No gross boney pedal deformities bilateral. No pain, crepitus, or limitation noted with foot and ankle range of motion bilateral. Muscular strength 5/5 in all groups tested bilateral.  She has swelling and pain on palpation of the talonavicular joint as well as attempted range of motion frontal plane of Chopart's joint.  Gait: Unassisted, Nonantalgic.    Radiographs:  Radiographs taken today demonstrate relatively normal-looking foot some demineralization of the bone that she does have complete bone to bone talonavicular articulation.  Assessment & Plan:  Assessment: Rheumatoid arthritis with talonavicular joint pain.  Plan: Injected the area today with 10 mg Kenalog  5 mg Marcaine to the point of maximal tenderness.  Will follow-up with her on an as-needed basis.     Jaran Sainz T. [place], [place]

## 2023-07-12 ENCOUNTER — Encounter: Payer: Self-pay | Admitting: Podiatry

## 2023-07-20 ENCOUNTER — Other Ambulatory Visit: Payer: Self-pay | Admitting: Obstetrics and Gynecology

## 2023-07-21 ENCOUNTER — Encounter: Payer: Self-pay | Admitting: Advanced Practice Midwife

## 2023-07-21 ENCOUNTER — Other Ambulatory Visit: Payer: Self-pay

## 2023-07-21 MED ORDER — FLUCONAZOLE 150 MG PO TABS: 150.0000 mg | ORAL_TABLET | Freq: Once | ORAL | 0 refills | Status: AC

## 2023-08-13 ENCOUNTER — Ambulatory Visit

## 2023-09-06 ENCOUNTER — Ambulatory Visit

## 2023-09-14 ENCOUNTER — Encounter: Payer: Self-pay | Admitting: Advanced Practice Midwife

## 2023-10-18 ENCOUNTER — Encounter: Payer: Self-pay | Admitting: Advanced Practice Midwife

## 2023-10-18 ENCOUNTER — Ambulatory Visit (INDEPENDENT_AMBULATORY_CARE_PROVIDER_SITE_OTHER): Admitting: Advanced Practice Midwife

## 2023-10-18 VITALS — BP 113/76 | HR 81 | Ht 62.0 in | Wt 168.2 lb

## 2023-10-18 DIAGNOSIS — N921 Excessive and frequent menstruation with irregular cycle: Secondary | ICD-10-CM | POA: Diagnosis not present

## 2023-10-18 DIAGNOSIS — Z3169 Encounter for other general counseling and advice on procreation: Secondary | ICD-10-CM

## 2023-10-18 NOTE — Progress Notes (Signed)
   GYNECOLOGY PROGRESS NOTE  History:  43 y.o. [id number] presents to [facility] office today for problem gyn visit. She reports frequent bleeding with Depo Provera  and wants to stop the medication. She started for mood regulation and does not need contraception at this time. She is considering another pregnancy with a sperm donor.  Prior to Depo, periods were regular.  She is in regular counseling  right now and started Prozac.    She denies h/a, dizziness, shortness of breath, n/v, or fever/chills.    The following portions of the patient's history were reviewed and updated as appropriate: allergies, current medications, past family history, past medical history, past social history, past surgical history and problem list. Last pap smear on 06/26/22 was normal, negative HRHPV.  Health Maintenance Due  Topic Date Due   DTaP/Tdap/Td (1 - Tdap) Never done   Pneumococcal Vaccine (1 of 2 - PCV) Never done   Hepatitis B Vaccines 19-59 Average Risk (1 of 3 - 19+ 3-dose series) Never done   HPV VACCINES (1 - 3-dose SCDM series) Never done   COVID-19 Vaccine (2 - Janssen risk series) 04/20/2019   Mammogram  Never done   Influenza Vaccine  Never done     Review of Systems:  Pertinent items are noted in HPI.   Objective:  Physical Exam Blood pressure 113/76, pulse 81, height 5' 2 (1.575 m), weight 168 lb 3.2 oz (76.3 kg). VS reviewed, nursing note reviewed,  Constitutional: well developed, well nourished, no distress HEENT: normocephalic CV: normal rate Pulm/chest wall: normal effort Breast Exam: deferred Abdomen: soft Neuro: alert and oriented x 3 Skin: warm, dry Psych: affect normal Pelvic exam: Deferred  Assessment & Plan:  1. Breakthrough bleeding on depo provera  (Primary) --Pt desires to stop, no additional appts scheduled --Interested in pregnancy  2. Pre-conception counseling --Pt to return for lab testing --F/U in office if periods do not return to normal in 3-4  months --Reviewed pt prescription medications, and pt to follow up with rheumatology about medications for RA recommended in pregnancy.  - Anti mullerian hormone - TSH   No follow-ups on file.   Olam Boards, CNM 5:04 PM

## 2023-10-18 NOTE — Progress Notes (Signed)
 Pt has been bleeding for 2.5 months following depo shot. Has also been spotting. Wishes to stop depo. Not interested in another form of birth control at this time.

## 2023-10-19 ENCOUNTER — Other Ambulatory Visit

## 2023-10-19 DIAGNOSIS — Z3169 Encounter for other general counseling and advice on procreation: Secondary | ICD-10-CM

## 2023-10-24 LAB — ANTI MULLERIAN HORMONE: ANTI-MULLERIAN HORMONE (AMH): 0.609 ng/mL

## 2023-10-24 LAB — TSH: TSH: 0.789 u[IU]/mL (ref 0.450–4.500)

## 2023-10-27 ENCOUNTER — Ambulatory Visit: Payer: Self-pay | Admitting: Advanced Practice Midwife
